# Patient Record
Sex: Female | Born: 1975 | Race: Asian | Hispanic: No | Marital: Married | Smoking: Never smoker
Health system: Southern US, Community
[De-identification: ages and names within clinical notes are randomized; demographics above are authoritative.]

## PROBLEM LIST (undated history)

## (undated) DIAGNOSIS — I10 Essential (primary) hypertension: Secondary | ICD-10-CM

## (undated) DIAGNOSIS — M549 Dorsalgia, unspecified: Secondary | ICD-10-CM

## (undated) DIAGNOSIS — Z923 Personal history of irradiation: Secondary | ICD-10-CM

## (undated) DIAGNOSIS — C801 Malignant (primary) neoplasm, unspecified: Secondary | ICD-10-CM

## (undated) DIAGNOSIS — K760 Fatty (change of) liver, not elsewhere classified: Secondary | ICD-10-CM

## (undated) HISTORY — DX: Essential (primary) hypertension: I10

## (undated) HISTORY — DX: Fatty (change of) liver, not elsewhere classified: K76.0

## (undated) HISTORY — DX: Dorsalgia, unspecified: M54.9

---

## 2007-02-22 DIAGNOSIS — M5126 Other intervertebral disc displacement, lumbar region: Secondary | ICD-10-CM | POA: Insufficient documentation

## 2010-05-13 DIAGNOSIS — E039 Hypothyroidism, unspecified: Secondary | ICD-10-CM | POA: Insufficient documentation

## 2014-11-03 DIAGNOSIS — E559 Vitamin D deficiency, unspecified: Secondary | ICD-10-CM | POA: Insufficient documentation

## 2014-11-03 DIAGNOSIS — R748 Abnormal levels of other serum enzymes: Secondary | ICD-10-CM | POA: Insufficient documentation

## 2016-06-26 DIAGNOSIS — E559 Vitamin D deficiency, unspecified: Secondary | ICD-10-CM | POA: Diagnosis not present

## 2016-06-26 DIAGNOSIS — Z0000 Encounter for general adult medical examination without abnormal findings: Secondary | ICD-10-CM | POA: Diagnosis not present

## 2016-06-26 DIAGNOSIS — Z124 Encounter for screening for malignant neoplasm of cervix: Secondary | ICD-10-CM | POA: Diagnosis not present

## 2016-06-26 DIAGNOSIS — E669 Obesity, unspecified: Secondary | ICD-10-CM | POA: Diagnosis not present

## 2016-06-26 DIAGNOSIS — Z6837 Body mass index (BMI) 37.0-37.9, adult: Secondary | ICD-10-CM | POA: Diagnosis not present

## 2016-06-26 DIAGNOSIS — Z113 Encounter for screening for infections with a predominantly sexual mode of transmission: Secondary | ICD-10-CM | POA: Diagnosis not present

## 2016-06-26 DIAGNOSIS — R945 Abnormal results of liver function studies: Secondary | ICD-10-CM | POA: Diagnosis not present

## 2016-06-26 DIAGNOSIS — Z975 Presence of (intrauterine) contraceptive device: Secondary | ICD-10-CM | POA: Diagnosis not present

## 2016-07-17 DIAGNOSIS — K760 Fatty (change of) liver, not elsewhere classified: Secondary | ICD-10-CM | POA: Diagnosis not present

## 2016-07-24 DIAGNOSIS — R7303 Prediabetes: Secondary | ICD-10-CM | POA: Diagnosis not present

## 2016-07-24 DIAGNOSIS — G8929 Other chronic pain: Secondary | ICD-10-CM | POA: Diagnosis not present

## 2016-07-24 DIAGNOSIS — E669 Obesity, unspecified: Secondary | ICD-10-CM | POA: Diagnosis not present

## 2016-07-24 DIAGNOSIS — M545 Low back pain: Secondary | ICD-10-CM | POA: Diagnosis not present

## 2016-07-25 DIAGNOSIS — R7303 Prediabetes: Secondary | ICD-10-CM | POA: Insufficient documentation

## 2016-08-02 DIAGNOSIS — Z309 Encounter for contraceptive management, unspecified: Secondary | ICD-10-CM | POA: Diagnosis not present

## 2016-08-07 DIAGNOSIS — Z713 Dietary counseling and surveillance: Secondary | ICD-10-CM | POA: Diagnosis not present

## 2016-08-07 DIAGNOSIS — E669 Obesity, unspecified: Secondary | ICD-10-CM | POA: Diagnosis not present

## 2016-08-08 DIAGNOSIS — R748 Abnormal levels of other serum enzymes: Secondary | ICD-10-CM | POA: Diagnosis not present

## 2016-08-08 DIAGNOSIS — G8929 Other chronic pain: Secondary | ICD-10-CM | POA: Diagnosis not present

## 2016-08-08 DIAGNOSIS — M545 Low back pain: Secondary | ICD-10-CM | POA: Diagnosis not present

## 2016-09-04 DIAGNOSIS — E669 Obesity, unspecified: Secondary | ICD-10-CM | POA: Diagnosis not present

## 2016-09-06 DIAGNOSIS — Z01812 Encounter for preprocedural laboratory examination: Secondary | ICD-10-CM | POA: Diagnosis not present

## 2016-09-06 DIAGNOSIS — Z30433 Encounter for removal and reinsertion of intrauterine contraceptive device: Secondary | ICD-10-CM | POA: Diagnosis not present

## 2016-09-06 DIAGNOSIS — Z975 Presence of (intrauterine) contraceptive device: Secondary | ICD-10-CM | POA: Insufficient documentation

## 2017-02-12 DIAGNOSIS — J302 Other seasonal allergic rhinitis: Secondary | ICD-10-CM | POA: Diagnosis not present

## 2017-02-19 DIAGNOSIS — J309 Allergic rhinitis, unspecified: Secondary | ICD-10-CM | POA: Insufficient documentation

## 2017-12-10 ENCOUNTER — Encounter: Payer: Self-pay | Admitting: Family

## 2017-12-10 ENCOUNTER — Ambulatory Visit: Payer: Managed Care, Other (non HMO) | Admitting: Family

## 2017-12-10 VITALS — BP 129/91 | HR 74 | Temp 98.6°F | Resp 16 | Ht 64.0 in | Wt 191.0 lb

## 2017-12-10 DIAGNOSIS — E669 Obesity, unspecified: Secondary | ICD-10-CM | POA: Diagnosis not present

## 2017-12-10 NOTE — Progress Notes (Signed)
Subjective:    Patient ID: Colleen Roach, female    DOB: Mar 25, 1976, 42 y.o.   MRN: [id number]  HPI  Patient is a 42 yr old female who presents today to establish care.  Reports tmax 2010 18 months ago. She reports that she was on phentermine x 1 year and got down ot 185.  She stopped about 2 months ago.    Wt Readings from Last 3 Encounters:  12/10/17 191 lb (86.6 kg)  Reports that she gained 60 pounds during her first pregnancy 13 years ago. She reports that she uses rowing machine 30 minutes 3 days a week.  24 hr diet recall:  Breakfast- yogurt drink (smoothie) coffee  Lunch- Fried rice  Dinner- chicken leg, noodles   Tangerine and apple for snack  She report hx of low back pain. Reports that she had a remote MRI and was told that she had DDD.  She has done PT in the past.  Reports pain is on/off.   Review of Systems  Constitutional: Negative for unexpected weight change.  HENT: Negative for rhinorrhea.   Eyes: Negative for visual disturbance.  Respiratory: Negative for cough.   Cardiovascular: Negative for leg swelling.  Gastrointestinal: Negative for constipation and diarrhea.  Genitourinary: Negative for dysuria and frequency.  Musculoskeletal: Negative for arthralgias and myalgias.  Skin:       Notes occasional rash on forehead, allergy medication helps  Neurological: Negative for headaches.  Hematological: Negative for adenopathy.  Psychiatric/Behavioral:       Denies depression/anxiety   Past Medical History:  Diagnosis Date  . Back pain      Social History   Socioeconomic History  . Marital status: Married    Spouse name: Not on file  . Number of children: 2  . Years of education: Restaurant manager, fast food  . Highest education level: Not on file  Social Needs  . Financial resource strain: Not hard at all  . Food insecurity - worry: Patient refused  . Food insecurity - inability: Patient refused  . Transportation needs - medical: No  . Transportation needs -  non-medical: No  Occupational History  . Occupation: Quarry manager  Tobacco Use  . Smoking status: Never Smoker  . Smokeless tobacco: Never Used  Substance and Sexual Activity  . Alcohol use: No    Frequency: Never  . Drug use: No  . Sexual activity: Yes    Partners: Male  Other Topics Concern  . 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 No pertinent surgical history.  Family History  Problem Relation Age of Onset  . Hypertension Mother   . Hypertension Father     Not on File  Current Outpatient Medications on File Prior to Visit  Medication Sig Dispense Refill  . Multiple Vitamins-Minerals (MULTIVITAMIN ADULT PO) Take by mouth.    . Omega-3 1000 MG CAPS Take by mouth.     No current facility-administered medications on file prior to visit.     BP (!) 129/91 (BP Location: Right Arm, Patient Position: Sitting, Cuff Size: Large)   Pulse 74   Temp 98.6 F (37 C) (Oral)   Resp 16   Ht 5'  4" (1.626 m)   Wt 191 lb (86.6 kg)   SpO2 100%   BMI 32.79 kg/m       Objective:   Physical Exam  Constitutional: She is oriented to person, place, and time. She appears well-developed and well-nourished.  Overweight Asian Female  HENT:  Head: Normocephalic and atraumatic.  Cardiovascular: Normal rate, regular rhythm and normal heart sounds.  No murmur heard. Pulmonary/Chest: Effort normal and breath sounds normal. No respiratory distress. She has no wheezes.  Abdominal: Soft. Bowel sounds are normal. She exhibits no distension. There is no tenderness. There is no rebound and no guarding.  Musculoskeletal: She exhibits no edema.  Lymphadenopathy:    She has no cervical adenopathy.  Neurological: She is alert and oriented to person, place, and  time.  Psychiatric: She has a normal mood and affect. Her behavior is normal. Judgment and thought content normal.          Assessment & Plan:  Obesity-we discussed adding more fresh vegetables to her diet and ensuring lean protein with each meal.  I suggested that she begin counting calories using an app such as my fitness pal.  She should set up set up a program with goal weight loss of 1-2 pounds per week.  I have suggested that she increase her exercise from 3-5 times weekly.  Try different exercises such as elliptical biking walking.  We discussed a referral to nutrition but she declines at this time and wishes to do on her own.  We will plan to bring her back in a few months to see how she is doing with her weight loss and to complete a physical with lab work.  A total of 25 minutes were spent face-to-face with the patient during this encounter and over half of that time was spent on counseling and coordination of care. The patient was counseled on healthy diet, exercise and weight loss.

## 2017-12-10 NOTE — Patient Instructions (Addendum)
Try using Myfitness Pal to set up a program to help you count calories. Goal weight loss is 1-2 pounds/week. Try to add more fresh veggies to your diet. Try to increase your exercise to 5 days a week.

## 2018-03-04 ENCOUNTER — Encounter: Payer: Self-pay | Admitting: Family

## 2018-03-04 ENCOUNTER — Ambulatory Visit (INDEPENDENT_AMBULATORY_CARE_PROVIDER_SITE_OTHER): Payer: Managed Care, Other (non HMO) | Admitting: Family

## 2018-03-04 VITALS — BP 142/82 | HR 79 | Temp 98.4°F | Resp 16 | Ht 64.0 in | Wt 197.8 lb

## 2018-03-04 DIAGNOSIS — R252 Cramp and spasm: Secondary | ICD-10-CM | POA: Diagnosis not present

## 2018-03-04 DIAGNOSIS — L989 Disorder of the skin and subcutaneous tissue, unspecified: Secondary | ICD-10-CM

## 2018-03-04 DIAGNOSIS — R102 Pelvic and perineal pain: Secondary | ICD-10-CM

## 2018-03-04 DIAGNOSIS — Z0001 Encounter for general adult medical examination with abnormal findings: Secondary | ICD-10-CM | POA: Diagnosis not present

## 2018-03-04 DIAGNOSIS — R030 Elevated blood-pressure reading, without diagnosis of hypertension: Secondary | ICD-10-CM

## 2018-03-04 DIAGNOSIS — Z0000 Encounter for general adult medical examination without abnormal findings: Secondary | ICD-10-CM | POA: Diagnosis not present

## 2018-03-04 LAB — MAGNESIUM: Magnesium: 2 mg/dL (ref 1.5–2.5)

## 2018-03-04 LAB — HCG, SERUM, QUALITATIVE: Preg, Serum: NEGATIVE

## 2018-03-04 MED ORDER — BETAMETHASONE DIPROPIONATE 0.05 % EX CREA: TOPICAL_CREAM | Freq: Two times a day (BID) | CUTANEOUS | 0 refills | Status: DC

## 2018-03-04 NOTE — Progress Notes (Addendum)
Subjective:    Patient ID: Colleen Roach, female    DOB: 1976/07/09, 42 y.o.   MRN: [id number]  HPI  Patient is a 42 yr old female who presents today for cpx.  Immunizations: unsure of exact date of last tetanus but believes <10 years ago. Diet: she reports that she eats a healthy diet. Eats between 10-4, limited sweets Breakfast:  Rice, chicken, vegetables 4PM rice, chicken, vegetables Drinks- water, occasional fruit/yogurt smoothie Exercise: enjoys walking during her breaks, 2x a day for 15 minutes, does a machine at home. Wt Readings from Last 3 Encounters:  03/04/18 197 lb 12.8 oz (89.7 kg)  12/10/17 191 lb (86.6 kg)   She reports that she is taking in about 1300 calories/day.  Pap Smear: 01/29/17 Mammogram: due Vision:  due Dental: had exam last week  Skin problem- reports skin lesions left side of face/neck.reports lesions can become very itchy and red at times. Allergy medication helps.  Pelvic Pain- reports + pain in the left pelvic area. Shereports that she has left sided pelvic pain with prolonged walking. This has been going on for a "long time."  Reports hx of ovarian cyst.    C/o intermittent leg cramping.    Review of Systems  Constitutional: Negative for unexpected weight change.  HENT: Negative for facial swelling and rhinorrhea.   Eyes: Negative for visual disturbance.  Respiratory: Negative for cough.   Cardiovascular: Negative for leg swelling.  Genitourinary: Negative for dysuria, frequency and hematuria.  Musculoskeletal: Positive for back pain. Negative for arthralgias and myalgias.  Skin:       Several spots on left face  Neurological: Negative for headaches.  Hematological: Negative for adenopathy.  Psychiatric/Behavioral:       Denies depression/anxiety   Past Medical History:  Diagnosis Date  . Back pain      Social History   Socioeconomic History  . Marital status: Married    Spouse name: Not on file  . Number of children: 2  . Years  of education: Restaurant manager, fast food  . Highest education level: Not on file  Occupational History  . Occupation: Quarry manager  Social Needs  . Financial resource strain: Not hard at all  . Food insecurity:    Worry: Patient refused    Inability: Patient refused  . Transportation needs:    Medical: No    Non-medical: No  Tobacco Use  . Smoking status: Never Smoker  . Smokeless tobacco: Never Used  Substance and Sexual Activity  . Alcohol use: No    Frequency: Never  . Drug use: No  . Sexual activity: Yes    Partners: Male  Lifestyle  . Physical activity:    Days per week: Not on file    Minutes per session: Not on file  . Stress: Not on file  Relationships  . Social connections:    Talks on phone: Not on file    Gets together: Not on file    Attends religious service: Not on file    Active member of club or organization: Not on file    Attends meetings of clubs or organizations: Not on file    Relationship status: Not on file  . Intimate partner violence:    Fear of current or ex partner: Not on file    Emotionally abused: Not on file    Physically abused: Not on file    Forced sexual activity: Not on file  Other Topics Concern  . 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  Family History  Problem Relation Age of Onset  . Hypertension Mother   . Hypertension Father     Not on File  Current Outpatient Medications on File Prior to Visit  Medication Sig Dispense Refill  . Multiple Vitamins-Minerals (MULTIVITAMIN ADULT PO) Take by mouth.    . Omega-3 1000 MG CAPS Take by mouth.     No current facility-administered medications on file prior to visit.     BP (!) 142/82 (BP Location: Left Arm, Patient Position: Sitting, Cuff Size:  Large)   Pulse 79   Temp 98.4 F (36.9 C) (Oral)   Resp 16   Ht 5\' 4"  (1.626 m)   Wt 197 lb 12.8 oz (89.7 kg)   SpO2 98%   BMI 33.95 kg/m       Objective:   Physical Exam  Physical Exam  Constitutional: She is oriented to person, place, and time. She appears well-developed and well-nourished. No distress.  HENT:  Head: Normocephalic and atraumatic.  Right Ear: Tympanic membrane and ear canal normal.  Left Ear: Tympanic membrane and ear canal normal.  Mouth/Throat: Oropharynx is clear and moist.  Eyes: Pupils are equal, round, and reactive to light. No scleral icterus.  Neck: Normal range of motion. No thyromegaly present.  Cardiovascular: Normal rate and regular rhythm.   No murmur heard. Pulmonary/Chest: Effort normal and breath sounds normal. No respiratory distress. He has no wheezes. She has no rales. She exhibits no tenderness.  Abdominal: Soft. Bowel sounds are normal. She exhibits no distension and no mass. There is very mild discomfort to palpation LLQ. There is no rebound and no guarding.  Musculoskeletal: She exhibits no edema.  Lymphadenopathy:    She has no cervical adenopathy.  Neurological: She is alert and oriented to person, place, and time. She has normal patellar reflexes. She exhibits normal muscle tone. Coordination normal.  Skin: Skin is warm and dry. several raised lesions noted near hairline left temple/behind ear (almost looks like pimples) Psychiatric: She has a normal mood and affect. Her behavior is normal. Judgment and thought content normal.  Breasts: Examined lying Right: Without masses, retractions, discharge or axillary adenopathy.  Left: Without masses, retractions, discharge or axillary adenopathy.             Assessment & Plan:    Elevated blood pressure reading- discussed low sodium diet. Follow up in 3 months for blood pressure recheck.  BP Readings from Last 3 Encounters:  03/04/18 (!) 142/82  12/10/17 (!) 129/91   Skin  lesions- ? Allergy related. Rx provided for betamethasone cream to be applied prn to affected areas.  Leg cramping- check magnesium.    Preventative care- discussed healthy diet, exercise, weight loss. Specifically decreasing calories to 1200/day and increasing her weight training.  Left pelvic pain- has hx of left sided ovarian cyst. Will obtain pelvic US. She has an IUD.  Will also check serum HCG.    Assessment & Plan:  EKG tracing is personally reviewed.  EKG notes NSR.  No acute changes.

## 2018-03-04 NOTE — Patient Instructions (Addendum)
Please complete lab work prior to leaving. Schedule mammogram on the first floor.  Please schedule a routine eye exam. Decrease your calorie intake to 1200 calories/day and try to add some light weight training.  Continue your cardio exercise. Work on low sodium diet.

## 2018-03-05 ENCOUNTER — Telehealth: Payer: Self-pay | Admitting: Family

## 2018-03-05 DIAGNOSIS — R945 Abnormal results of liver function studies: Secondary | ICD-10-CM

## 2018-03-05 DIAGNOSIS — R7989 Other specified abnormal findings of blood chemistry: Secondary | ICD-10-CM

## 2018-03-05 LAB — CBC WITH DIFFERENTIAL/PLATELET
BASOS PCT: 0.8 %
Basophils Absolute: 60 cells/uL (ref 0–200)
Eosinophils Absolute: 300 cells/uL (ref 15–500)
Eosinophils Relative: 4 %
HCT: 39.7 % (ref 35.0–45.0)
Hemoglobin: 13.4 g/dL (ref 11.7–15.5)
Lymphs Abs: 3210 cells/uL (ref 850–3900)
MCH: 28.5 pg (ref 27.0–33.0)
MCHC: 33.8 g/dL (ref 32.0–36.0)
MCV: 84.3 fL (ref 80.0–100.0)
MONOS PCT: 4.6 %
MPV: 11.1 fL (ref 7.5–12.5)
NEUTROS PCT: 47.8 %
Neutro Abs: 3585 cells/uL (ref 1500–7800)
PLATELETS: 244 10*3/uL (ref 140–400)
RBC: 4.71 10*6/uL (ref 3.80–5.10)
RDW: 12.1 % (ref 11.0–15.0)
TOTAL LYMPHOCYTE: 42.8 %
WBC mixed population: 345 cells/uL (ref 200–950)
WBC: 7.5 10*3/uL (ref 3.8–10.8)

## 2018-03-05 LAB — LIPID PANEL
CHOLESTEROL: 195 mg/dL (ref <200)
HDL: 40 mg/dL — ABNORMAL LOW (ref >50)
LDL CHOLESTEROL (CALC): 132 mg/dL — AB
Non-HDL Cholesterol (Calc): 155 mg/dL (calc) — ABNORMAL HIGH (ref <130)
Total CHOL/HDL Ratio: 4.9 (calc) (ref <5.0)
Triglycerides: 124 mg/dL (ref <150)

## 2018-03-05 LAB — HEPATIC FUNCTION PANEL
AG RATIO: 1.5 (calc) (ref 1.0–2.5)
ALBUMIN MSPROF: 4.2 g/dL (ref 3.6–5.1)
ALT: 50 U/L — AB (ref 6–29)
AST: 24 U/L (ref 10–30)
Alkaline phosphatase (APISO): 51 U/L (ref 33–115)
Bilirubin, Direct: 0.1 mg/dL (ref 0.0–0.2)
GLOBULIN: 2.8 g/dL (ref 1.9–3.7)
Indirect Bilirubin: 0.3 mg/dL (calc) (ref 0.2–1.2)
TOTAL PROTEIN: 7 g/dL (ref 6.1–8.1)
Total Bilirubin: 0.4 mg/dL (ref 0.2–1.2)

## 2018-03-05 LAB — BASIC METABOLIC PANEL
BUN: 12 mg/dL (ref 7–25)
CHLORIDE: 103 mmol/L (ref 98–110)
CO2: 23 mmol/L (ref 20–32)
Calcium: 9.2 mg/dL (ref 8.6–10.2)
Creat: 0.62 mg/dL (ref 0.50–1.10)
Glucose, Bld: 88 mg/dL (ref 65–99)
Potassium: 4.1 mmol/L (ref 3.5–5.3)
Sodium: 136 mmol/L (ref 135–146)

## 2018-03-05 LAB — URINALYSIS, ROUTINE W REFLEX MICROSCOPIC
Bilirubin Urine: NEGATIVE
Glucose, UA: NEGATIVE
HGB URINE DIPSTICK: NEGATIVE
KETONES UR: NEGATIVE
LEUKOCYTES UA: NEGATIVE
Nitrite: NEGATIVE
PROTEIN: NEGATIVE
Specific Gravity, Urine: 1.019 (ref 1.001–1.03)
pH: 5.5 (ref 5.0–8.0)

## 2018-03-05 LAB — TSH: TSH: 2.87 mIU/L

## 2018-03-05 LAB — HIV ANTIBODY (ROUTINE TESTING W REFLEX): HIV 1&2 Ab, 4th Generation: NONREACTIVE

## 2018-03-05 NOTE — Telephone Encounter (Signed)
Pregnancy test is negative, magnesium is negative. Kidney function, electrolytes, sugar look good.  HDL "good cholesterol" is a bit low and HDL bad cholesterol mildly elevated. Please continue to work on low fat/low cholesterol diet, exercise, weight loss. One of her liver tests is elevated.  I would like her to return for some additional blood work and abdominal US to check liver and gallbladder. Orders have been pended.

## 2018-03-07 NOTE — Telephone Encounter (Signed)
Called but no answer and mailbox was full. Will try again later.  

## 2018-03-10 DIAGNOSIS — R358 Other polyuria: Secondary | ICD-10-CM | POA: Diagnosis not present

## 2018-03-10 DIAGNOSIS — J020 Streptococcal pharyngitis: Secondary | ICD-10-CM | POA: Diagnosis not present

## 2018-03-11 ENCOUNTER — Encounter (HOSPITAL_BASED_OUTPATIENT_CLINIC_OR_DEPARTMENT_OTHER): Payer: Self-pay

## 2018-03-11 ENCOUNTER — Ambulatory Visit (HOSPITAL_BASED_OUTPATIENT_CLINIC_OR_DEPARTMENT_OTHER)
Admission: RE | Admit: 2018-03-11 | Discharge: 2018-03-11 | Disposition: A | Discharge status: Home / Self Care | Payer: Managed Care, Other (non HMO) | Source: Ambulatory Visit | Attending: Family | Admitting: Family

## 2018-03-11 DIAGNOSIS — Z0000 Encounter for general adult medical examination without abnormal findings: Secondary | ICD-10-CM | POA: Insufficient documentation

## 2018-03-11 DIAGNOSIS — Z1231 Encounter for screening mammogram for malignant neoplasm of breast: Secondary | ICD-10-CM | POA: Insufficient documentation

## 2018-03-11 DIAGNOSIS — R9389 Abnormal findings on diagnostic imaging of other specified body structures: Secondary | ICD-10-CM | POA: Diagnosis not present

## 2018-03-11 DIAGNOSIS — R102 Pelvic and perineal pain: Secondary | ICD-10-CM | POA: Diagnosis not present

## 2018-03-12 ENCOUNTER — Ambulatory Visit: Payer: Managed Care, Other (non HMO) | Admitting: Family

## 2018-03-12 ENCOUNTER — Encounter: Payer: Self-pay | Admitting: Family

## 2018-03-12 VITALS — BP 122/79 | HR 93 | Temp 99.3°F | Resp 16 | Ht 64.0 in | Wt 200.0 lb

## 2018-03-12 DIAGNOSIS — J020 Streptococcal pharyngitis: Secondary | ICD-10-CM | POA: Diagnosis not present

## 2018-03-12 DIAGNOSIS — R7989 Other specified abnormal findings of blood chemistry: Secondary | ICD-10-CM

## 2018-03-12 DIAGNOSIS — N83202 Unspecified ovarian cyst, left side: Secondary | ICD-10-CM

## 2018-03-12 DIAGNOSIS — R945 Abnormal results of liver function studies: Secondary | ICD-10-CM | POA: Diagnosis not present

## 2018-03-12 MED ORDER — CEFUROXIME AXETIL 500 MG PO TABS: 500.0000 mg | ORAL_TABLET | Freq: Two times a day (BID) | ORAL | 0 refills | Status: AC

## 2018-03-12 NOTE — Patient Instructions (Addendum)
You should be contacted about your abdominal ultrasound to check your liver, and referral to GYN for further evaluation of your ovarian cyst.  Please complete lab work prior to leaving.  Start ceftin for sore throat.

## 2018-03-12 NOTE — Progress Notes (Signed)
Subjective:    Patient ID: Colleen Roach, female    DOB: [birth year], 42 y.o.   MRN: [id number]  HPI  Strep Throat - Diagnosed on Saturday 03/08/18. Went to UC on Sunday. Was given amoxicillin and she thinks thar she had a reaction. Had "thick mucous (in throat) and diarrhea." She took x 2 days then stopped due to side effects. She reports that her sore throat is "much better."     Review of Systems See HPI  Past Medical History:  Diagnosis Date  . Back pain      Social History   Socioeconomic History  . Marital status: Married    Spouse name: Not on file  . Number of children: 2  . Years of education: Restaurant manager, fast food  . Highest education level: Not on file  Occupational History  . Occupation: Quarry manager  Social Needs  . Financial resource strain: Not hard at all  . Food insecurity:    Worry: Patient refused    Inability: Patient refused  . Transportation needs:    Medical: No    Non-medical: No  Tobacco Use  . Smoking status: Never Smoker  . Smokeless tobacco: Never Used  Substance and Sexual Activity  . Alcohol use: No    Frequency: Never  . Drug use: No  . Sexual activity: Yes    Partners: Male    Birth control/protection: IUD  Lifestyle  . Physical activity:    Days per week: Not on file    Minutes per session: Not on file  . Stress: Not on file  Relationships  . Social connections:    Talks on phone: Not on file    Gets together: Not on file    Attends religious service: Not on file    Active member of club or organization: Not on file    Attends meetings of clubs or organizations: Not on file    Relationship status: Not on file  . Intimate partner violence:    Fear of current or ex partner: Not on file    Emotionally abused: Not on file    Physically abused: Not on file    Forced sexual activity: Not on file  Other Topics Concern  . 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  Family History  Problem Relation Age of Onset  . Hypertension Mother   . Hypertension Father     Allergies  Allergen Reactions  . Augmentin [Amoxicillin-Pot Clavulanate]     Mucous in throat and diarrhea  . Fish Allergy Swelling    Facial Swelling   . Pollen Extract   . Almond (Diagnostic) Rash  . Cashew Nut Oil Rash  . Eggs Or Egg-Derived Products Rash  . Naproxen Sodium Rash  . Peanut-Containing Drug Products Rash  . Shellfish Allergy Rash    Current Outpatient Medications on File Prior to Visit  Medication Sig Dispense Refill  . amoxicillin-clavulanate (AUGMENTIN) 250-62.5 MG/5ML suspension     . betamethasone dipropionate (DIPROLENE) 0.05 % cream Apply topically 2 (two) times daily. 30 g 0  . Multiple Vitamins-Minerals (MULTIVITAMIN ADULT PO) Take by mouth.    Marland Kitchen  Omega-3 1000 MG CAPS Take by mouth.     No current facility-administered medications on file prior to visit.     BP 122/79 (BP Location: Left Arm, Patient Position: Sitting, Cuff Size: Large)   Pulse 93   Temp 99.3 F (37.4 C) (Oral)   Resp 16   Ht 5\' 4"  (1.626 m)   Wt 200 lb (90.7 kg)   SpO2 98%   BMI 34.33 kg/m       Objective:   Physical Exam  Constitutional: She appears well-developed and well-nourished.  HENT:  Right Ear: Tympanic membrane and ear canal normal.  Left Ear: Tympanic membrane and ear canal normal.  Mouth/Throat: No oropharyngeal exudate, posterior oropharyngeal edema or posterior oropharyngeal erythema.  Neck: Neck supple.  Cardiovascular: Normal rate, regular rhythm and normal heart sounds.  No murmur heard. Pulmonary/Chest: Effort normal and breath sounds normal. No respiratory distress. She has no wheezes.  Psychiatric: She has a normal mood and affect. Her behavior is normal. Judgment and thought  content normal.          Assessment & Plan:  Strep Throat- rx with ceftin to complete treatment. Clinically improved.  L adnexal cyst- recently noted on pelvic US. Will obtain CA-125 and refer to GYN for further evaluation.  Elevated LFT-Recently noted on lab work.  Pt reports that her numbers are actually much improved. Suspect fatty liver. Check abdominal US to further evaluate.  Lab Results  Component Value Date   ALT 50 (H) 03/04/2018   AST 24 03/04/2018   BILITOT 0.4 03/04/2018

## 2018-03-12 NOTE — Progress Notes (Deleted)
Went to UC on Sunday. Was given amoxicillin and she things thar she had a reaction. Had "thick mucous and diarrhea." She took x 2 days then stopped due to side effects. She reports that her sore throat is "much better."

## 2018-03-12 NOTE — Telephone Encounter (Signed)
Pt in office today and given below results.

## 2018-03-13 LAB — CA 125: CA 125: 20 U/mL (ref <35)

## 2018-03-18 ENCOUNTER — Ambulatory Visit (HOSPITAL_BASED_OUTPATIENT_CLINIC_OR_DEPARTMENT_OTHER)
Admission: RE | Admit: 2018-03-18 | Discharge: 2018-03-18 | Disposition: A | Discharge status: Home / Self Care | Payer: Managed Care, Other (non HMO) | Source: Ambulatory Visit | Attending: Family | Admitting: Family

## 2018-03-18 ENCOUNTER — Telehealth: Payer: Self-pay | Admitting: Family

## 2018-03-18 ENCOUNTER — Encounter: Payer: Self-pay | Admitting: Family

## 2018-03-18 DIAGNOSIS — K760 Fatty (change of) liver, not elsewhere classified: Secondary | ICD-10-CM | POA: Insufficient documentation

## 2018-03-18 DIAGNOSIS — K753 Granulomatous hepatitis, not elsewhere classified: Secondary | ICD-10-CM | POA: Diagnosis not present

## 2018-03-18 DIAGNOSIS — R945 Abnormal results of liver function studies: Secondary | ICD-10-CM | POA: Diagnosis present

## 2018-03-18 DIAGNOSIS — K7689 Other specified diseases of liver: Secondary | ICD-10-CM | POA: Diagnosis not present

## 2018-03-18 NOTE — Telephone Encounter (Signed)
[place] suggests fatty liver. Treatment is low fat diet/exercise/weight loss.

## 2018-03-18 NOTE — Telephone Encounter (Signed)
Notified pt and she voices understanding. 

## 2018-03-27 ENCOUNTER — Encounter

## 2018-03-27 ENCOUNTER — Ambulatory Visit: Payer: Managed Care, Other (non HMO) | Admitting: Obstetrics and Gynecology

## 2018-04-25 ENCOUNTER — Other Ambulatory Visit: Payer: Self-pay

## 2018-04-25 ENCOUNTER — Ambulatory Visit: Payer: Managed Care, Other (non HMO) | Admitting: Obstetrics and Gynecology

## 2018-04-25 ENCOUNTER — Encounter

## 2018-04-25 ENCOUNTER — Encounter: Payer: Self-pay | Admitting: Obstetrics and Gynecology

## 2018-04-25 VITALS — BP 132/80 | HR 84 | Resp 16 | Ht 63.5 in | Wt 200.0 lb

## 2018-04-25 DIAGNOSIS — N949 Unspecified condition associated with female genital organs and menstrual cycle: Secondary | ICD-10-CM

## 2018-04-25 DIAGNOSIS — N393 Stress incontinence (female) (male): Secondary | ICD-10-CM

## 2018-04-25 DIAGNOSIS — N9489 Other specified conditions associated with female genital organs and menstrual cycle: Secondary | ICD-10-CM

## 2018-04-25 DIAGNOSIS — R102 Pelvic and perineal pain: Secondary | ICD-10-CM | POA: Diagnosis not present

## 2018-04-25 NOTE — Patient Instructions (Addendum)
Endometriosis Endometriosis is a condition in which the tissue that lines the uterus (endometrium) grows outside of its normal location. The tissue may grow in many locations close to the uterus, but it commonly grows on the ovaries, fallopian tubes, vagina, or bowel. When the uterus sheds the endometrium every menstrual cycle, there is bleeding wherever the endometrial tissue is located. This can cause pain because blood is irritating to tissues that are not normally exposed to it. What are the causes? The cause of endometriosis is not known. What increases the risk? You may be more likely to develop endometriosis if you:  Have a family history of endometriosis.  Have never given birth.  Started your period at age 10 or younger.  Have high levels of estrogen in your body.  Were exposed to a certain medicine (diethylstilbestrol) before you were born (in utero).  Had low birth weight.  Were born as a twin, triplet, or other multiple.  Have a BMI of less than 25. BMI is an estimate of body fat and is calculated from height and weight.  What are the signs or symptoms? Often, there are no symptoms of this condition. If you do have symptoms, they may:  Vary depending on where your endometrial tissue is growing.  Occur during your menstrual period (most common) or midcycle.  Come and go, or you may go months with no symptoms at all.  Stop with menopause.  Symptoms may include:  Pain in the back or abdomen.  Heavier bleeding during periods.  Pain during sex.  Painful bowel movements.  Infertility.  Pelvic pain.  Bleeding more than once a month.  How is this diagnosed? This condition is diagnosed based on your symptoms and a physical exam. You may have tests, such as:  Blood tests and urine tests. These may be done to help rule out other possible causes of your symptoms.  Ultrasound, to look for abnormal tissues.  An X-ray of the lower bowel (barium enema).  An  ultrasound that is done through the vagina (transvaginally).  CT scan.  MRI.  Laparoscopy. In this procedure, a lighted, pencil-sized instrument called a laparoscope is inserted into your abdomen through an incision. The laparoscope allows your health care provider to look at the organs inside your body and check for abnormal tissue to confirm the diagnosis. If abnormal tissue is found, your health care provider may remove a small piece of tissue (biopsy) to be examined under a microscope.  How is this treated? Treatment for this condition may include:  Medicines to relieve pain, such as NSAIDs.  Hormone therapy. This involves using artificial (synthetic) hormones to reduce endometrial tissue growth. Your health care provider may recommend using a hormonal form of birth control, or other medicines.  Surgery. This may be done to remove abnormal endometrial tissue. ? In some cases, tissue may be removed using a laparoscope and a laser (laparoscopic laser treatment). ? In severe cases, surgery may be done to remove the fallopian tubes, uterus, and ovaries (hysterectomy).  Follow these instructions at home:  Take over-the-counter and prescription medicines only as told by your health care provider.  Do not drive or use heavy machinery while taking prescription pain medicine.  Try to avoid activities that cause pain, including sexual activity.  Keep all follow-up visits as told by your health care provider. This is important. Contact a health care provider if:  You have pain in the area between your hip bones (pelvic area) that occurs: ? Before, during, or   after your period. ? In between your period and gets worse during your period. ? During or after sex. ? With bowel movements or urination, especially during your period.  You have problems getting pregnant.  You have a fever. Get help right away if:  You have severe pain that does not get better with medicine.  You have severe  nausea and vomiting, or you cannot eat without vomiting.  You have pain that affects only the lower, right side of your abdomen.  You have abdominal pain that gets worse.  You have abdominal swelling.  You have blood in your stool. This information is not intended to replace advice given to you by your health care provider. Make sure you discuss any questions you have with your health care provider. Document Released: 10/13/2000 Document Revised: 07/21/2016 Document Reviewed: 03/18/2016 Elsevier Interactive Patient Education  2018 [employer]. Kegel Exercises Kegel exercises help strengthen the muscles that support the rectum, vagina, small intestine, bladder, and uterus. Doing Kegel exercises can help:  Improve bladder and bowel control.  Improve sexual response.  Reduce problems and discomfort during pregnancy.  Kegel exercises involve squeezing your pelvic floor muscles, which are the same muscles you squeeze when you try to stop the flow of urine. The exercises can be done while sitting, standing, or lying down, but it is best to vary your position. Phase 1 exercises 1. Squeeze your pelvic floor muscles tight. You should feel a tight lift in your rectal area. If you are a female, you should also feel a tightness in your vaginal area. Keep your stomach, buttocks, and legs relaxed. 2. Hold the muscles tight for up to 10 seconds. 3. Relax your muscles. Repeat this exercise 50 times a day or as many times as told by your health care provider. Continue to do this exercise for at least 4-6 weeks or for as long as told by your health care provider. This information is not intended to replace advice given to you by your health care provider. Make sure you discuss any questions you have with your health care provider. Document Released: 10/02/2012 Document Revised: 06/10/2016 Document Reviewed: 09/05/2015 Elsevier Interactive Patient Education  2018 [employer]. Ovarian Cyst An ovarian  cyst is a fluid-filled sac that forms on an ovary. The ovaries are small organs that produce eggs in women. Various types of cysts can form on the ovaries. Some may cause symptoms and require treatment. Most ovarian cysts go away on their own, are not cancerous (are benign), and do not cause problems. Common types of ovarian cysts include:  Functional (follicle) cysts. ? Occur during the menstrual cycle, and usually go away with the next menstrual cycle if you do not get pregnant. ? Usually cause no symptoms.  Endometriomas. ? Are cysts that form from the tissue that lines the uterus (endometrium). ? Are sometimes called "chocolate cysts" because they become filled with blood that turns brown. ? Can cause pain in the lower abdomen during intercourse and during your period.  Cystadenoma cysts. ? Develop from cells on the outside surface of the ovary. ? Can get very large and cause lower abdomen pain and pain with intercourse. ? Can cause severe pain if they twist or break open (rupture).  Dermoid cysts. ? Are sometimes found in both ovaries. ? May contain different kinds of body tissue, such as skin, teeth, hair, or cartilage. ? Usually do not cause symptoms unless they get very big.  Theca lutein cysts. ? Occur when too much  of a certain hormone (human chorionic gonadotropin) is produced and overstimulates the ovaries to produce an egg. ? Are most common after having procedures used to assist with the conception of a baby (in vitro fertilization).  What are the causes? Ovarian cysts may be caused by:  Ovarian hyperstimulation syndrome. This is a condition that can develop from taking fertility medicines. It causes multiple large ovarian cysts to form.  Polycystic ovarian syndrome (PCOS). This is a common hormonal disorder that can cause ovarian cysts, as well as problems with your period or fertility.  What increases the risk? The following factors may make you more likely to  develop ovarian cysts:  Being overweight or obese.  Taking fertility medicines.  Taking certain forms of hormonal birth control.  Smoking.  What are the signs or symptoms? Many ovarian cysts do not cause symptoms. If symptoms are present, they may include:  Pelvic pain or pressure.  Pain in the lower abdomen.  Pain during sex.  Abdominal swelling.  Abnormal menstrual periods.  Increasing pain with menstrual periods.  How is this diagnosed? These cysts are commonly found during a routine pelvic exam. You may have tests to find out more about the cyst, such as:  Ultrasound.  X-ray of the pelvis.  CT scan.  MRI.  Blood tests.  How is this treated? Many ovarian cysts go away on their own without treatment. Your health care provider may want to check your cyst regularly for 2-3 months to see if it changes. If you are in menopause, it is especially important to have your cyst monitored closely because menopausal women have a higher rate of ovarian cancer. When treatment is needed, it may include:  Medicines to help relieve pain.  A procedure to drain the cyst (aspiration).  Surgery to remove the whole cyst.  Hormone treatment or birth control pills. These methods are sometimes used to help dissolve a cyst.  Follow these instructions at home:  Take over-the-counter and prescription medicines only as told by your health care provider.  Do not drive or use heavy machinery while taking prescription pain medicine.  Get regular pelvic exams and Pap tests as often as told by your health care provider.  Return to your normal activities as told by your health care provider. Ask your health care provider what activities are safe for you.  Do not use any products that contain nicotine or tobacco, such as cigarettes and e-cigarettes. If you need help quitting, ask your health care provider.  Keep all follow-up visits as told by your health care provider. This is  important. Contact a health care provider if:  Your periods are late, irregular, or painful, or they stop.  You have pelvic pain that does not go away.  You have pressure on your bladder or trouble emptying your bladder completely.  You have pain during sex.  You have any of the following in your abdomen: ? A feeling of fullness. ? Pressure. ? Discomfort. ? Pain that does not go away. ? Swelling.  You feel generally ill.  You become constipated.  You lose your appetite.  You develop severe acne.  You start to have more body hair and facial hair.  You are gaining weight or losing weight without changing your exercise and eating habits.  You think you may be pregnant. Get help right away if:  You have abdominal pain that is severe or gets worse.  You cannot eat or drink without vomiting.  You suddenly develop a fever.  Your menstrual period is much heavier than usual. This information is not intended to replace advice given to you by your health care provider. Make sure you discuss any questions you have with your health care provider. Document Released: 10/16/2005 Document Revised: 05/05/2016 Document Reviewed: 03/19/2016 Elsevier Interactive Patient Education  [employer].

## 2018-04-25 NOTE — Progress Notes (Signed)
42 y.o. [id number] MarriedAsianF here referred by PCP, [name], NP for oa consultation for a pelvic mass.  She has a mirena IUD replaced about a year ago. No cycles. She c/o an intermittent pressure in her LLQ/pelvis for years. Recent ultrasound showed:  Left ovary Measurements: 2.5 x 2.1 by 2.6 cm.  A bilobed cystic lesion is seen in the left adnexa which measures 7.0 x 3.8 by 6.4 cm. This has uniform low-level internal echoes and no definite internal blood flow on color Doppler ultrasound. This is suspicious for endometrioma, although cystic ovarian neoplasm cannot definitely be Excluded.  CA 125 was 20.  H/O mild back pain with her cycles in the past.  In 2012 she was told that she had a cyst on the left side, was told to have yearly ultrasounds, never got around to the ultrasounds.   Sexually active, no pain.     No LMP recorded. (Menstrual status: IUD).          Sexually active: Yes.    The current method of family planning is IUD.    Exercising: Yes.    Walking/ biking  Smoker:  no  Health Maintenance: Pap:  2017 normal per patient  History of abnormal Pap:  no MMG:  03-11-18 WNL  Colonoscopy:  Never BMD:   Never TDaP:  2011 Gardasil: no    reports that she has never smoked. She has never used smokeless tobacco. She reports that she does not drink alcohol or use drugs. Works as a Web designer. Daughters are 13 and 6.5.   Past Medical History:  Diagnosis Date  . Back pain   . Fatty liver     History reviewed. No pertinent surgical history.  Current Outpatient Medications  Medication Sig Dispense Refill  . Multiple Vitamins-Minerals (MULTIVITAMIN ADULT PO) Take by mouth.    . Omega-3 1000 MG CAPS Take by mouth.     No current facility-administered medications for this visit.     Family History  Problem Relation Age of Onset  . Hypertension Mother   . Hypertension Father     Review of Systems  Constitutional: Negative.   HENT: Negative.   Eyes:  Negative.   Respiratory: Negative.   Cardiovascular: Negative.   Gastrointestinal: Negative.   Endocrine: Negative.   Genitourinary: Positive for pelvic pain.       LLQ pain   Musculoskeletal: Negative.   Skin: Negative.   Allergic/Immunologic: Negative.   Neurological: Negative.   Psychiatric/Behavioral: Negative.   She c/o GSI only when she has a URI  Exam:   BP 132/80 (BP Location: Right Arm, Patient Position: Sitting, Cuff Size: Normal)   Pulse 84   Resp 16   Ht 5' 3.5" (1.613 m)   Wt 200 lb (90.7 kg)   BMI 34.87 kg/m   Weight change: @WEIGHTCHANGE @ Height:   Height: 5' 3.5" (161.3 cm)  Ht Readings from Last 3 Encounters:  04/25/18 5' 3.5" (1.613 m)  03/12/18 5\' 4"  (1.626 m)  03/04/18 5\' 4"  (1.626 m)    General appearance: alert, cooperative and appears stated age Head: Normocephalic, without obvious abnormality, atraumatic Neck: no adenopathy, supple, symmetrical, trachea midline and thyroid normal to inspection and palpation Lungs: clear to auscultation bilaterally Cardiovascular: regular rate and rhythm Abdomen: soft, non-tender; non distended,  no masses,  no organomegaly Extremities: extremities normal, atraumatic, no cyanosis or edema Skin: Skin color, texture, turgor normal. No rashes or lesions Lymph nodes: Cervical, supraclavicular, and axillary nodes normal. No abnormal inguinal nodes  palpated Neurologic: Grossly normal   Pelvic: External genitalia:  no lesions              Urethra:  normal appearing urethra with no masses, tenderness or lesions              Bartholins and Skenes: normal                 Vagina: normal appearing vagina with normal color and discharge, no lesions              Cervix: no lesions and IUD string 2 cm               Bimanual Exam:  Uterus:  normal size, contour, position, consistency, mobility, non-tender and anteverted              Adnexa: no mass, fullness, tenderness               Rectovaginal: on rectovaginal exam a mass is  felt in the left adnexa, not mobile               Anus:  normal sphincter tone, no lesions  Chaperone was present for exam.  A:  Left adnexal mass, suspect endometrioma, 7 cm  P:    Will try and get a copy of her prior ultrasound, h/o cyst on the left  Recommended laparoscopy, with left ovarian cystectomy, possible left oophorectomy, possible treatment of endometriosis, discussed the option of bilateral salpingectomy (she will consider, may want to continue with the IUD and better coverage if she has it for contraception)  Discussed risks of laparoscopy, including but not limited to: infection, damage to bowel/bladder/ureters or vessels.   Would do a bowel prep  CC: [name], NP Note sent

## 2018-06-02 NOTE — Progress Notes (Signed)
Subjective:    Patient ID: Colleen Roach, female    DOB: 06-18-1976, 42 y.o.   MRN: [id number]  HPI  Colleen Roach is a 42 yr old female who presents today for follow up.   She was noted previously to have an elevated blood pressure.  BP Readings from Last 3 Encounters:  06/03/18 133/82  04/25/18 132/80  03/12/18 122/79     Skin lesions- reports no lesions today but that she will sometimes get itchy hives after she eats nuts. Has not see allergist.  Pelvic Pain/left adnexal mass- noted on [place]. She was referred to GYN and evaluated by Dr. Sumner Boast. She recommended  laparoscopy, with left ovarian cystectomy, possible left oophorectomy, possible treatment of endometriosis. This has not been completed. She tells me that she has decided to proceed with surgery.      Review of Systems See HPI  Past Medical History:  Diagnosis Date  . Back pain   . Fatty liver   . Hypertension      Social History   Socioeconomic History  . Marital status: Married    Spouse name: Not on file  . Number of children: 2  . Years of education: Restaurant manager, fast food  . Highest education level: Not on file  Occupational History  . Occupation: Quarry manager  Social Needs  . Financial resource strain: Not hard at all  . Food insecurity:    Worry: Patient refused    Inability: Patient refused  . Transportation needs:    Medical: No    Non-medical: No  Tobacco Use  . Smoking status: Never Smoker  . Smokeless tobacco: Never Used  Substance and Sexual Activity  . Alcohol use: No    Frequency: Never  . Drug use: No  . Sexual activity: Yes    Partners: Male    Birth control/protection: IUD    Comment: Mirena   Lifestyle  . Physical activity:    Days per week: Not on file    Minutes per session: Not on file  . Stress: Not on file  Relationships  . Social connections:    Talks on phone: Not on file    Gets together: Not on file    Attends religious service: Not on file    Active member of club or  organization: Not on file    Attends meetings of clubs or organizations: Not on file    Relationship status: Not on file  . Intimate partner violence:    Fear of current or ex partner: Not on file    Emotionally abused: Not on file    Physically abused: Not on file    Forced sexual activity: Not on file  Other Topics Concern  . 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  Family History  Problem Relation Age of Onset  . Hypertension Mother   . Hypertension Father     Allergies  Allergen Reactions  . Augmentin [Amoxicillin-Pot Clavulanate]     Mucous in throat and diarrhea  . Fish Allergy Swelling    Facial Swelling   . Pollen Extract   . Almond (Diagnostic) Rash  .  Cashew Nut Oil Rash  . Eggs Or Egg-Derived Products Rash  . Naproxen Sodium Rash  . Peanut-Containing Drug Products Rash  . Shellfish Allergy Rash    Current Outpatient Medications on File Prior to Visit  Medication Sig Dispense Refill  . Multiple Vitamins-Minerals (MULTIVITAMIN ADULT PO) Take by mouth.    . Omega-3 1000 MG CAPS Take by mouth.     No current facility-administered medications on file prior to visit.     BP 133/82 (BP Location: Right Arm, Cuff Size: Large)   Pulse 74   Temp 98 F (36.7 C) (Oral)   Resp 18   Ht 5\' 4"  (1.626 m)   Wt 200 lb 9.6 oz (91 kg)   SpO2 100%   BMI 34.43 kg/m       Objective:   Physical Exam  Constitutional: She is oriented to person, place, and time. She appears well-developed and well-nourished.  Cardiovascular: Normal rate, regular rhythm and normal heart sounds.  No murmur heard. Pulmonary/Chest: Effort normal and breath sounds normal. No respiratory distress. She has no wheezes.  Neurological: She  is alert and oriented to person, place, and time.  Skin: Skin is warm and dry.  Psychiatric: She has a normal mood and affect. Her behavior is normal. Judgment and thought content normal.          Assessment & Plan:  Elevated blood pressure reading-BP looks good today.  We will continue to monitor.  Intermittent urticaria- will refer to allergist. Discussed nut avoidance and keeping an epipen on hand in case of a severe allergic reaction.  We discussed that if she develops tongue/lip swelling or shortness of breath to administer the EpiPen and call 911.  She requests a refill of her betamethasone cream.  Refill has been provided.  Pelvic mass-this is being managed by gynecology and she plans to proceed with surgery.

## 2018-06-03 ENCOUNTER — Encounter: Payer: Self-pay | Admitting: Family

## 2018-06-03 ENCOUNTER — Ambulatory Visit: Payer: Managed Care, Other (non HMO) | Admitting: Family

## 2018-06-03 VITALS — BP 133/82 | HR 74 | Temp 98.0°F | Resp 18 | Ht 64.0 in | Wt 200.6 lb

## 2018-06-03 DIAGNOSIS — L509 Urticaria, unspecified: Secondary | ICD-10-CM | POA: Diagnosis not present

## 2018-06-03 DIAGNOSIS — R1900 Intra-abdominal and pelvic swelling, mass and lump, unspecified site: Secondary | ICD-10-CM | POA: Diagnosis not present

## 2018-06-03 DIAGNOSIS — Z91018 Allergy to other foods: Secondary | ICD-10-CM

## 2018-06-03 DIAGNOSIS — R030 Elevated blood-pressure reading, without diagnosis of hypertension: Secondary | ICD-10-CM

## 2018-06-03 MED ORDER — EPINEPHRINE 0.3 MG/0.3ML IJ SOAJ: 0.3000 mg | Freq: Once | INTRAMUSCULAR | 0 refills | Status: AC

## 2018-06-03 MED ORDER — BETAMETHASONE DIPROPIONATE 0.05 % EX CREA: TOPICAL_CREAM | Freq: Two times a day (BID) | CUTANEOUS | 0 refills | Status: DC

## 2018-06-03 NOTE — Patient Instructions (Signed)
Please keep an epipen with you in case of severe allergic reaction (tongue/lip swelling/shortness of breath). Avoid nuts.  You should be contacted about scheduling your appointment with the allergist.

## 2019-03-15 IMAGING — MG DIGITAL SCREENING BILATERAL MAMMOGRAM WITH TOMO AND CAD
4 series · 4 of 16 positions shown · non-contrast
Comparison: None.

CLINICAL DATA: Screening.

EXAM:
DIGITAL SCREENING BILATERAL MAMMOGRAM WITH TOMO AND CAD

[L MLO synth-2D]
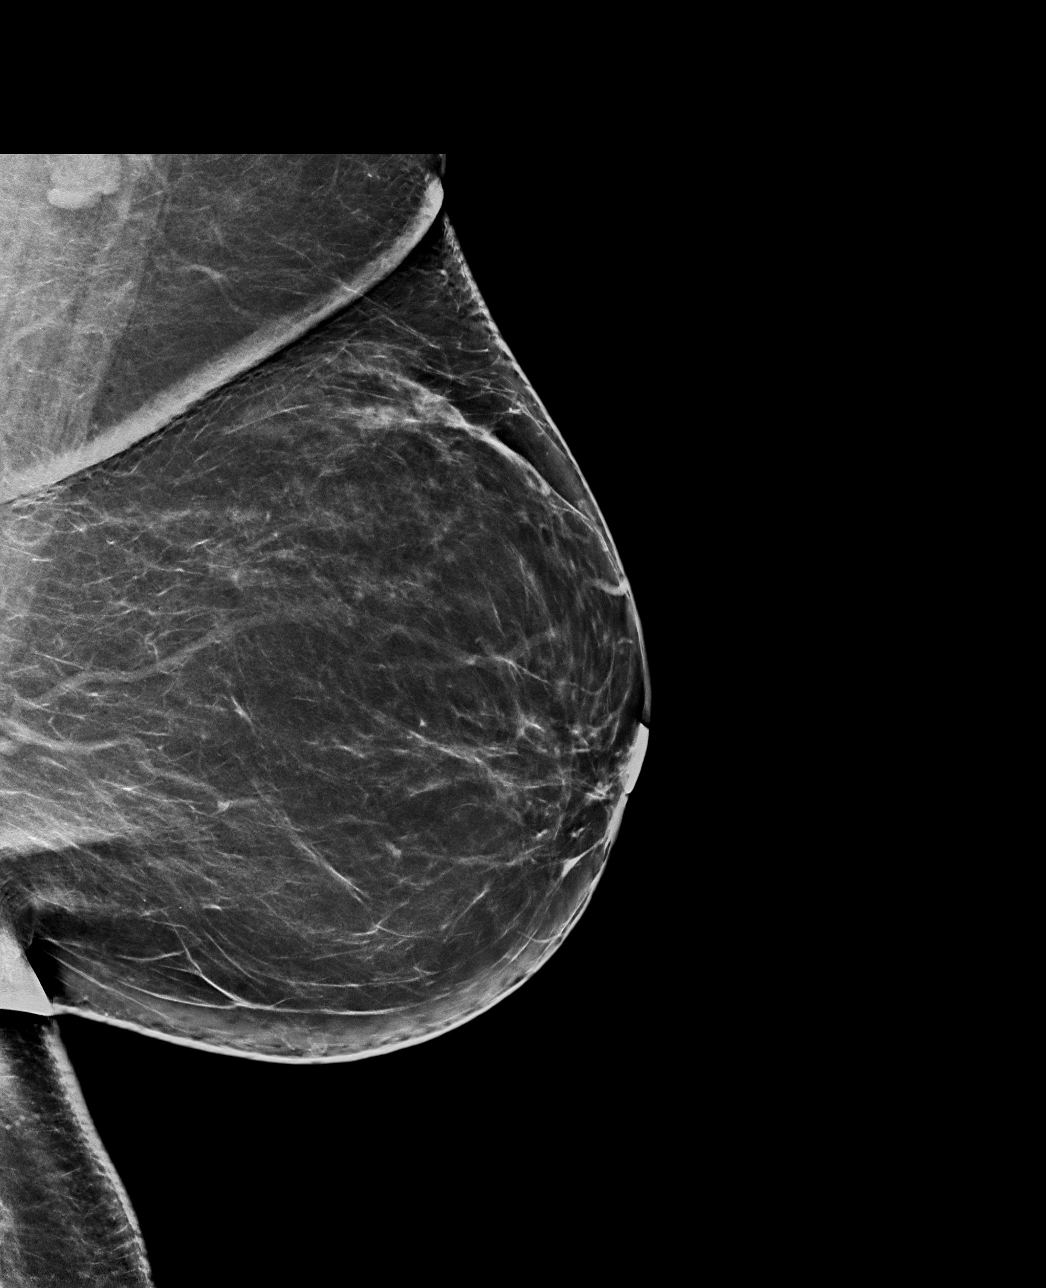

[L MLO tomo (1 of 2) · tomo slice 41/82.0]
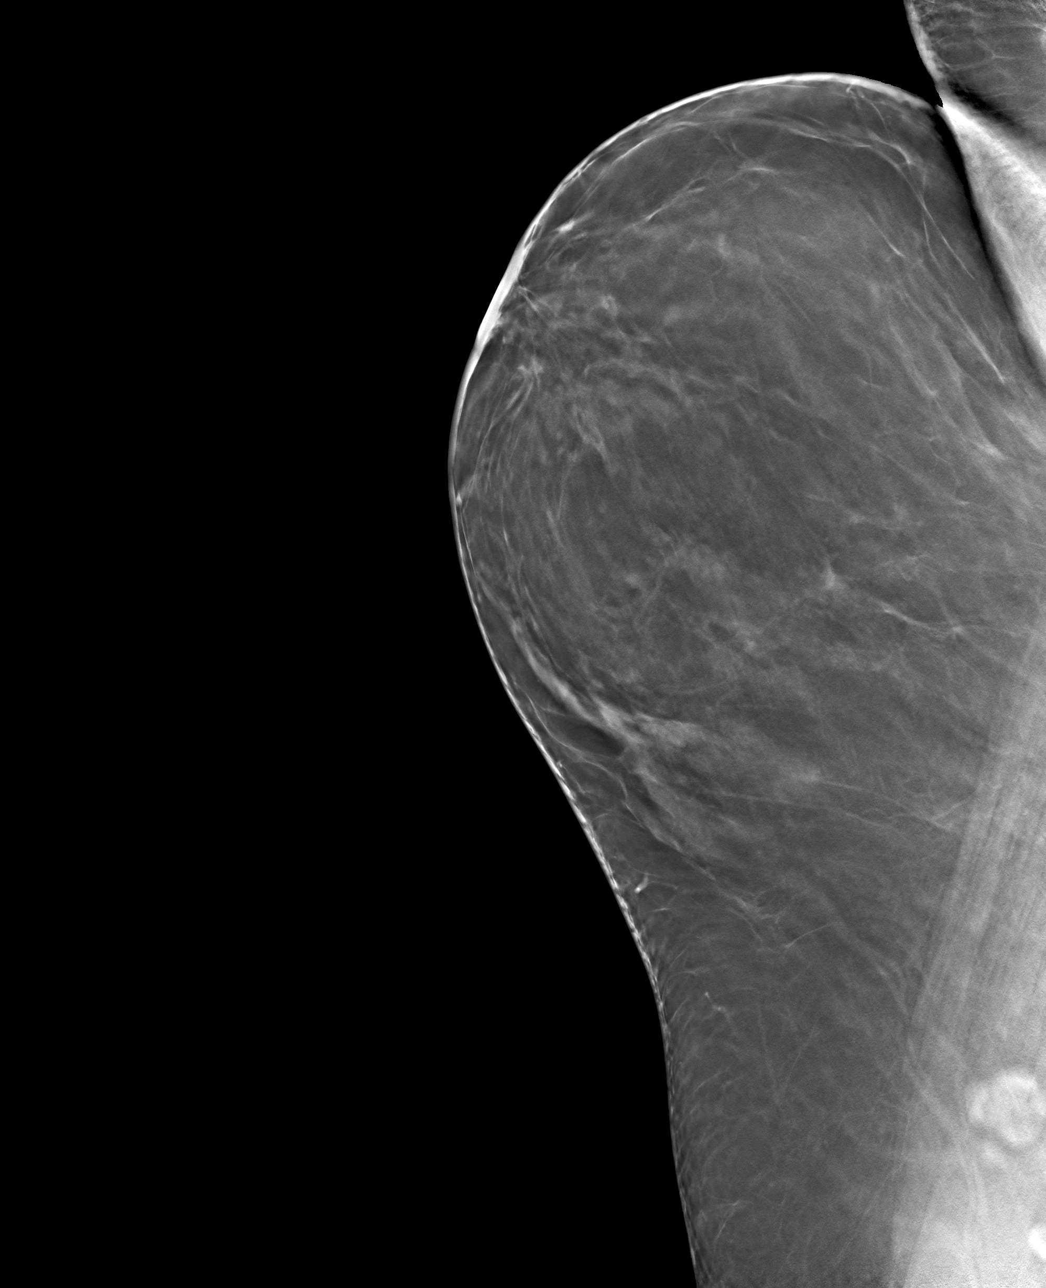

[L MLO tomo (2 of 2) · tomo slice 41/82.0]
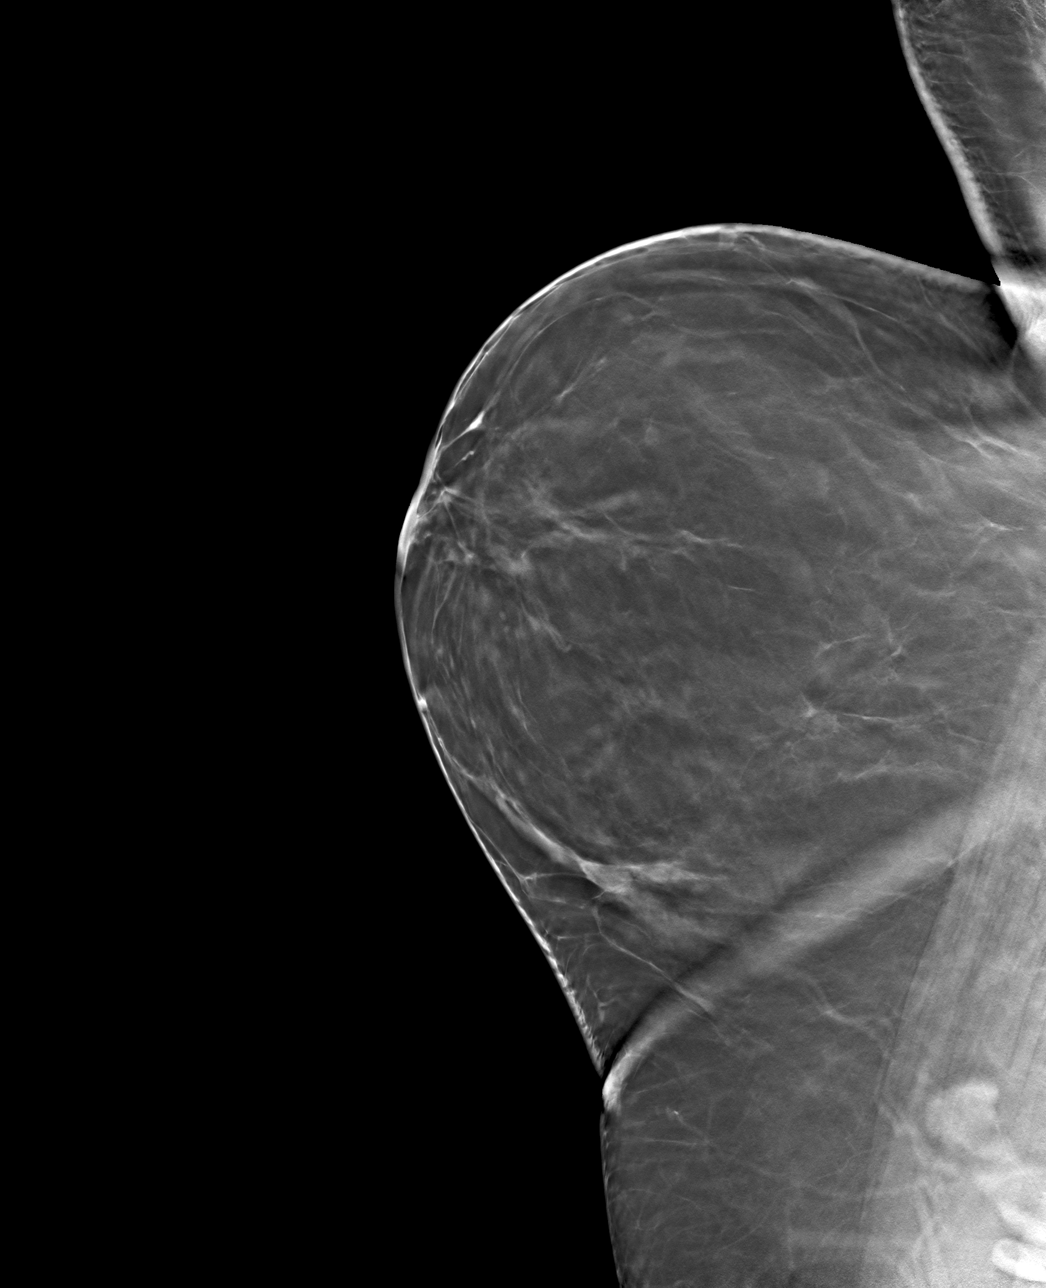

[R MLO tomo · tomo slice 43/86.0]
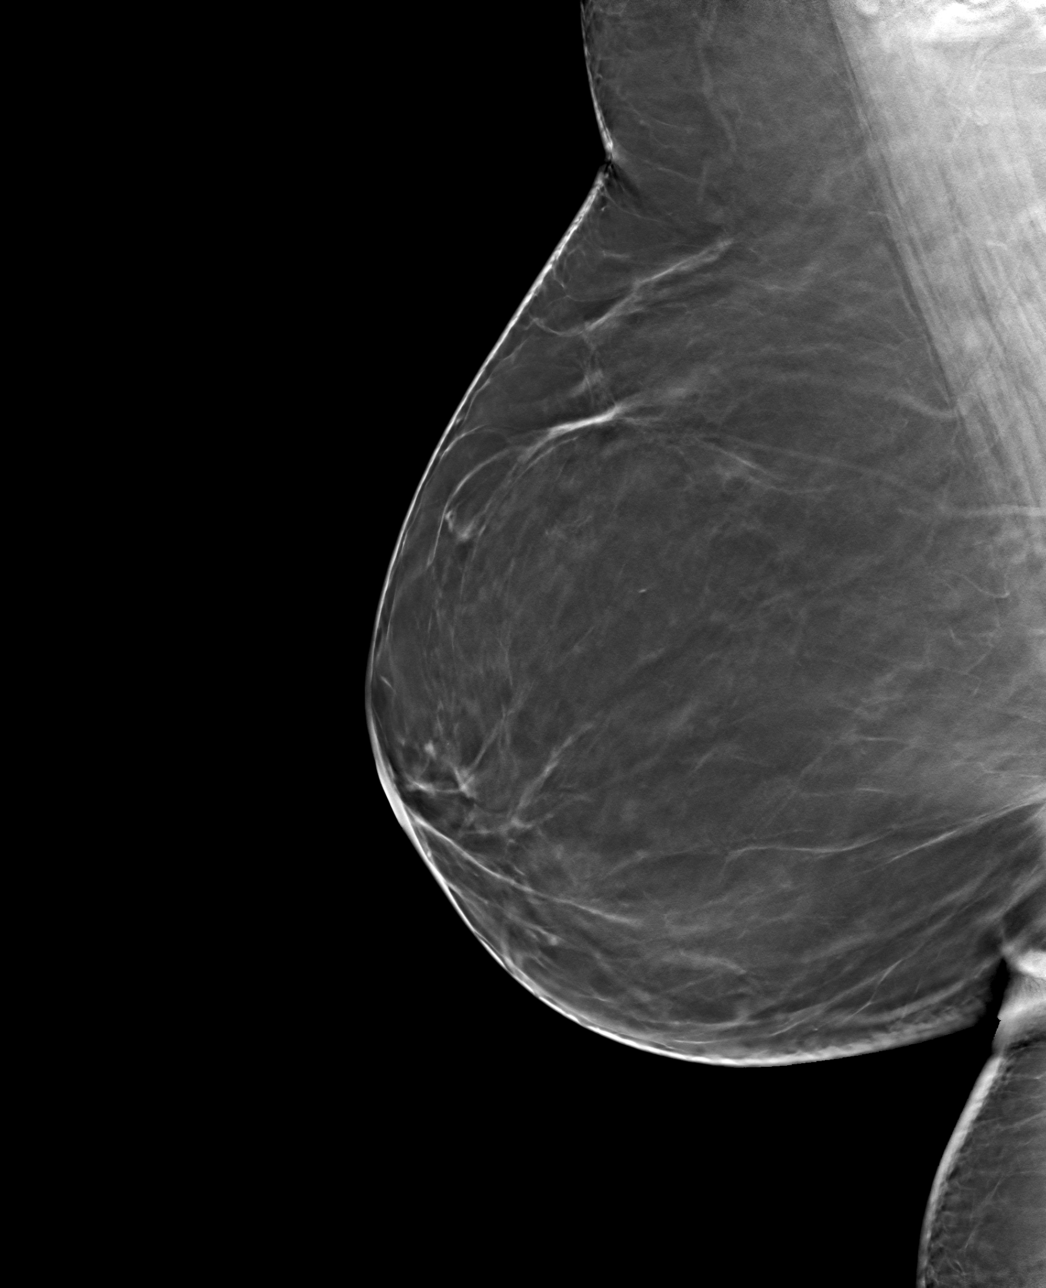

[4 of 16 positions shown; findings below may reference images not displayed]

ACR Breast Density Category b: There are scattered areas of
fibroglandular density.
FINDINGS: There are no findings suspicious for malignancy. Images were
processed with CAD.
IMPRESSION: No mammographic evidence of malignancy. A result letter of this
screening mammogram will be mailed directly to the patient.

RECOMMENDATION:
Screening mammogram in one year. (Code:[id number]-EJ6)

BI-RADS CATEGORY  1: Negative.

## 2019-03-17 ENCOUNTER — Encounter: Payer: Managed Care, Other (non HMO) | Admitting: Family

## 2019-03-19 ENCOUNTER — Ambulatory Visit (INDEPENDENT_AMBULATORY_CARE_PROVIDER_SITE_OTHER): Payer: 59 | Admitting: Family

## 2019-03-19 ENCOUNTER — Other Ambulatory Visit: Payer: Self-pay

## 2019-03-19 ENCOUNTER — Encounter: Payer: Self-pay | Admitting: Family

## 2019-03-19 ENCOUNTER — Telehealth: Payer: Self-pay | Admitting: Family

## 2019-03-19 VITALS — BP 152/96 | HR 88 | Temp 98.8°F | Resp 16 | Ht 64.0 in | Wt 214.0 lb

## 2019-03-19 DIAGNOSIS — Z0000 Encounter for general adult medical examination without abnormal findings: Secondary | ICD-10-CM | POA: Diagnosis not present

## 2019-03-19 DIAGNOSIS — Z23 Encounter for immunization: Secondary | ICD-10-CM | POA: Diagnosis not present

## 2019-03-19 MED ORDER — LORATADINE 10 MG PO TABS: 10.0000 mg | ORAL_TABLET | Freq: Every day | ORAL | 3 refills | Status: AC

## 2019-03-19 MED ORDER — ONE-A-DAY WOMENS FORMULA PO TABS: ORAL_TABLET | ORAL | 3 refills | Status: AC

## 2019-03-19 MED ORDER — GUAIFENESIN ER 600 MG PO TB12: 600.0000 mg | ORAL_TABLET | Freq: Two times a day (BID) | ORAL | 5 refills | Status: DC | PRN

## 2019-03-19 MED ORDER — OMEGA-3 1000 MG PO CAPS: ORAL_CAPSULE | ORAL | 3 refills | Status: DC

## 2019-03-19 NOTE — Patient Instructions (Addendum)
Please schedule a routine eye exam. You may use mucinex as needed for chest congestion.  Please continue to work on healthy diet, portion control, regular exercise and weight loss.

## 2019-03-19 NOTE — Progress Notes (Signed)
Subjective:    Patient ID: Colleen Roach, female    DOB: 1975-12-19, 43 y.o.   MRN: [id number]  HPI  Patient presents today for complete physical.  Immunizations: due for tetanus Diet:reports diet is healthy Wt Readings from Last 3 Encounters:  03/19/19 214 lb (97.1 kg)  06/03/18 200 lb 9.6 oz (91 kg)  04/25/18 200 lb (90.7 kg)  Exercise: walks 40 minutes every other  Pap Smear: due 4/21, sees GYN Mammogram: due  Dental: up to date Vision: never  BP Readings from Last 3 Encounters:  03/19/19 (!) 152/96  06/03/18 133/82  04/25/18 132/80     Review of Systems  Constitutional: Positive for unexpected weight change.  HENT: Negative for postnasal drip.        Reports some upper airway congestion- chronic despite otc allergy medicine  Eyes: Negative for visual disturbance.  Respiratory: Negative for cough and shortness of breath.   Cardiovascular: Negative for chest pain and leg swelling.  Gastrointestinal: Negative for diarrhea, nausea and vomiting.  Genitourinary: Negative for dysuria, frequency and hematuria.  Musculoskeletal: Negative for arthralgias and myalgias.  Skin: Negative for rash.  Neurological: Negative for headaches.  Hematological: Negative for adenopathy.  Psychiatric/Behavioral:       Denies depression/anxiety   Past Medical History:  Diagnosis Date  . Back pain   . Fatty liver   . Hypertension      Social History   Socioeconomic History  . Marital status: Married    Spouse name: Not on file  . Number of children: 2  . Years of education: Restaurant manager, fast food  . Highest education level: Not on file  Occupational History  . Occupation: Quarry manager  Social Needs  . Financial resource strain: Not hard at all  . Food insecurity:    Worry: Patient refused    Inability: Patient refused  . Transportation needs:    Medical: No    Non-medical: No  Tobacco Use  . Smoking status: Never Smoker  . Smokeless tobacco: Never Used  Substance and Sexual Activity   . Alcohol use: No    Frequency: Never  . Drug use: No  . Sexual activity: Yes    Partners: Male    Birth control/protection: I.U.D.    Comment: Mirena   Lifestyle  . Physical activity:    Days per week: Not on file    Minutes per session: Not on file  . Stress: Not on file  Relationships  . Social connections:    Talks on phone: Not on file    Gets together: Not on file    Attends religious service: Not on file    Active member of club or organization: Not on file    Attends meetings of clubs or organizations: Not on file    Relationship status: Not on file  . Intimate partner violence:    Fear of current or ex partner: Not on file    Emotionally abused: Not on file    Physically abused: Not on file    Forced sexual activity: Not on file  Other Topics Concern  . 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  Family History  Problem Relation Age of Onset  . Hypertension Mother   . Hypertension Father     Allergies  Allergen Reactions  . Augmentin [Amoxicillin-Pot Clavulanate]     Mucous in throat and diarrhea  . Fish Allergy Swelling    Facial Swelling   . Pollen Extract   . Almond (Diagnostic) Rash  . Cashew Nut Oil Rash  . Eggs Or Egg-Derived Products Rash  . Naproxen Sodium Rash  . Peanut-Containing Drug Products Rash  . Shellfish Allergy Rash    Current Outpatient Medications on File Prior to Visit  Medication Sig Dispense Refill  . betamethasone dipropionate (DIPROLENE) 0.05 % cream Apply topically 2 (two) times daily. 30 g 0   No current facility-administered medications on file prior to visit.     BP (!) 152/96 (BP Location: Right Arm, Patient Position: Sitting, Cuff Size: Large)   Pulse 88    Temp 98.8 F (37.1 C) (Oral)   Resp 16   Ht 5\' 4"  (1.626 m)   Wt 214 lb (97.1 kg)   SpO2 99%   BMI 36.73 kg/m       Objective:   Physical Exam Physical Exam  Constitutional: She is oriented to person, place, and time. She appears well-developed and well-nourished. No distress.  HENT:  Head: Normocephalic and atraumatic.  Right Ear: Tympanic membrane and ear canal normal.  Left Ear: Tympanic membrane and ear canal normal.  Mouth/Throat: Oropharynx is clear and moist.  Eyes: Pupils are equal, round, and reactive to light. No scleral icterus.  Neck: Normal range of motion. No thyromegaly present.  Cardiovascular: Normal rate and regular rhythm.   No murmur heard. Pulmonary/Chest: Effort normal and breath sounds normal. No respiratory distress. He has no wheezes. She has no rales. She exhibits no tenderness.  Abdominal: Soft. Bowel sounds are normal. She exhibits no distension and no mass. There is no tenderness. There is no rebound and no guarding.  Musculoskeletal: She exhibits no edema.  Lymphadenopathy:    She has no cervical adenopathy.  Neurological: She is alert and oriented to person, place, and time. She has normal patellar reflexes. She exhibits normal muscle tone. Coordination normal.  Skin: Skin is warm and dry.  Psychiatric: She has a normal mood and affect. Her behavior is normal. Judgment and thought content normal.  Breasts: Examined lying Right: Without masses, retractions, discharge or axillary adenopathy.  Left: Without masses, retractions, discharge or axillary adenopathy.          Assessment & Plan:          Assessment & Plan:  Elevated blood pressure reading- Will plan BP recheck in 1 month.   Preventative care- discussed healthy diet, exercise and weight loss.  Pap up to date. Td today. Obtain routine lab work.  She is requesting rx for OTC meds so she can use her flex spend. Advised pt to stop fish oil due to hx of fish allergy. Notes if she does  not take allergy med with it can "have a reaction." refer for mammogram. Recommended that she schedule a routine eye exam.

## 2019-03-19 NOTE — Telephone Encounter (Signed)
Please let pt know that I saw that her BP was elevated at her visit today. I think we should repeat her blood pressure in 1 month with me. Limit sodium and work on exercise and weight loss.

## 2019-03-21 ENCOUNTER — Telehealth: Payer: Self-pay | Admitting: *Deleted

## 2019-03-21 NOTE — Telephone Encounter (Signed)
Rod Holler -- I received a call from the Lab that they received an add on request from you but they said they did not have any blood on this pt.  I called [facility]) and added tests.

## 2019-03-21 NOTE — Telephone Encounter (Signed)
Information and provider's advise given to patient. Scheduled for in office follow up 04-21-19

## 2019-03-24 LAB — CBC WITH DIFFERENTIAL/PLATELET
Absolute Monocytes: 451 cells/uL (ref 200–950)
Basophils Absolute: 68 cells/uL (ref 0–200)
Basophils Relative: 0.8 %
Eosinophils Absolute: 323 cells/uL (ref 15–500)
Eosinophils Relative: 3.8 %
HCT: 42.9 % (ref 35.0–45.0)
Hemoglobin: 14 g/dL (ref 11.7–15.5)
Lymphs Abs: 3264 cells/uL (ref 850–3900)
MCH: 28.2 pg (ref 27.0–33.0)
MCHC: 32.6 g/dL (ref 32.0–36.0)
MCV: 86.3 fL (ref 80.0–100.0)
MPV: 11.5 fL (ref 7.5–12.5)
Monocytes Relative: 5.3 %
Neutro Abs: 4395 cells/uL (ref 1500–7800)
Neutrophils Relative %: 51.7 %
Platelets: 274 10*3/uL (ref 140–400)
RBC: 4.97 10*6/uL (ref 3.80–5.10)
RDW: 13 % (ref 11.0–15.0)
Total Lymphocyte: 38.4 %
WBC: 8.5 10*3/uL (ref 3.8–10.8)

## 2019-03-24 LAB — TEST AUTHORIZATION

## 2019-03-24 LAB — T3, FREE: T3, Free: 2.7 pg/mL (ref 2.3–4.2)

## 2019-03-24 LAB — LIPID PANEL
Cholesterol: 225 mg/dL — ABNORMAL HIGH (ref <200)
HDL: 43 mg/dL — ABNORMAL LOW (ref >=50)
LDL Cholesterol (Calc): 148 mg/dL (calc) — ABNORMAL HIGH
Non-HDL Cholesterol (Calc): 182 mg/dL (calc) — ABNORMAL HIGH (ref <130)
Total CHOL/HDL Ratio: 5.2 (calc) — ABNORMAL HIGH (ref <5.0)
Triglycerides: 195 mg/dL — ABNORMAL HIGH (ref <150)

## 2019-03-24 LAB — BASIC METABOLIC PANEL
BUN: 12 mg/dL (ref 7–25)
CO2: 20 mmol/L (ref 20–32)
Calcium: 9.5 mg/dL (ref 8.6–10.2)
Chloride: 104 mmol/L (ref 98–110)
Creat: 0.64 mg/dL (ref 0.50–1.10)
Glucose, Bld: 95 mg/dL (ref 65–99)
Potassium: 4.3 mmol/L (ref 3.5–5.3)
Sodium: 138 mmol/L (ref 135–146)

## 2019-03-24 LAB — HEPATIC FUNCTION PANEL
AG Ratio: 1.6 (calc) (ref 1.0–2.5)
ALT: 69 U/L — ABNORMAL HIGH (ref 6–29)
AST: 34 U/L — ABNORMAL HIGH (ref 10–30)
Albumin: 4.5 g/dL (ref 3.6–5.1)
Alkaline phosphatase (APISO): 53 U/L (ref 31–125)
Bilirubin, Direct: 0.1 mg/dL (ref 0.0–0.2)
Globulin: 2.8 g/dL (calc) (ref 1.9–3.7)
Indirect Bilirubin: 0.3 mg/dL (calc) (ref 0.2–1.2)
Total Bilirubin: 0.4 mg/dL (ref 0.2–1.2)
Total Protein: 7.3 g/dL (ref 6.1–8.1)

## 2019-03-24 LAB — TSH: TSH: 5.07 mIU/L — ABNORMAL HIGH

## 2019-03-24 LAB — T4, FREE: Free T4: 1.2 ng/dL (ref 0.8–1.8)

## 2019-03-25 NOTE — Telephone Encounter (Signed)
Noted, thanks!

## 2019-03-31 ENCOUNTER — Other Ambulatory Visit: Payer: Self-pay

## 2019-03-31 ENCOUNTER — Encounter (HOSPITAL_BASED_OUTPATIENT_CLINIC_OR_DEPARTMENT_OTHER): Payer: Self-pay

## 2019-03-31 ENCOUNTER — Ambulatory Visit (HOSPITAL_BASED_OUTPATIENT_CLINIC_OR_DEPARTMENT_OTHER)
Admission: RE | Admit: 2019-03-31 | Discharge: 2019-03-31 | Disposition: A | Discharge status: Home / Self Care | Payer: 59 | Source: Ambulatory Visit | Attending: Family | Admitting: Family

## 2019-03-31 DIAGNOSIS — E039 Hypothyroidism, unspecified: Secondary | ICD-10-CM

## 2019-03-31 DIAGNOSIS — E038 Other specified hypothyroidism: Secondary | ICD-10-CM

## 2019-03-31 DIAGNOSIS — Z1231 Encounter for screening mammogram for malignant neoplasm of breast: Secondary | ICD-10-CM | POA: Diagnosis present

## 2019-03-31 DIAGNOSIS — Z0000 Encounter for general adult medical examination without abnormal findings: Secondary | ICD-10-CM

## 2019-04-21 ENCOUNTER — Ambulatory Visit: Payer: 59 | Admitting: Family

## 2019-04-25 ENCOUNTER — Ambulatory Visit: Payer: 59 | Admitting: Family

## 2019-04-25 ENCOUNTER — Other Ambulatory Visit: Payer: Self-pay

## 2019-04-25 ENCOUNTER — Encounter: Payer: Self-pay | Admitting: Family

## 2019-04-25 VITALS — BP 149/77 | HR 68 | Temp 98.6°F | Resp 16

## 2019-04-25 DIAGNOSIS — I10 Essential (primary) hypertension: Secondary | ICD-10-CM | POA: Diagnosis not present

## 2019-04-25 MED ORDER — AMLODIPINE BESYLATE 5 MG PO TABS: 5.0000 mg | ORAL_TABLET | Freq: Every day | ORAL | 3 refills | Status: DC

## 2019-04-25 NOTE — Patient Instructions (Signed)
Please begin amlodipine 5mg  once daily for your blood pressure.

## 2019-04-25 NOTE — Progress Notes (Signed)
Subjective:    Patient ID: Colleen Roach, female    DOB: 08-Mar-1976, 43 y.o.   MRN: [id number]  HPI  Patient is a 43 yr old female who presents today for a 1 month follow up of her blood pressure. Last visit bp was noted to be elevated.  We discussed dietary changes and advised pt to follow up for repeat blood pressure.   She denies LE edema, SOB or headache.  BP Readings from Last 3 Encounters:  04/25/19 (!) 149/77  03/19/19 (!) 152/96  06/03/18 133/82      Review of Systems    see HPI  Past Medical History:  Diagnosis Date  . Back pain   . Fatty liver   . Hypertension      Social History   Socioeconomic History  . Marital status: Married    Spouse name: Not on file  . Number of children: 2  . Years of education: Restaurant manager, fast food  . Highest education level: Not on file  Occupational History  . Occupation: Quarry manager  Social Needs  . Financial resource strain: Not hard at all  . Food insecurity    Worry: Patient refused    Inability: Patient refused  . Transportation needs    Medical: No    Non-medical: No  Tobacco Use  . Smoking status: Never Smoker  . Smokeless tobacco: Never Used  Substance and Sexual Activity  . Alcohol use: No    Frequency: Never  . Drug use: No  . Sexual activity: Yes    Partners: Male    Birth control/protection: I.U.D.    Comment: Mirena   Lifestyle  . Physical activity    Days per week: Not on file    Minutes per session: Not on file  . Stress: Not on file  Relationships  . Social Herbalist on phone: Not on file    Gets together: Not on file    Attends religious service: Not on file    Active member of club or organization: Not on file    Attends meetings of clubs or organizations: Not on file    Relationship status: Not on file  . Intimate partner violence    Fear of current or ex partner: Not on file    Emotionally abused: Not on file    Physically abused: Not on file    Forced sexual activity: Not on file   Other Topics Concern  . 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  Family History  Problem Relation Age of Onset  . Hypertension Mother   . Hypertension Father     Allergies  Allergen Reactions  . Augmentin [Amoxicillin-Pot Clavulanate]     Mucous in throat and diarrhea  . Fish Allergy Swelling    Facial Swelling   . Pollen Extract   . Almond (Diagnostic) Rash  . Cashew Nut Oil Rash  . Eggs Or Egg-Derived Products Rash  . Naproxen Sodium Rash  . Peanut-Containing Drug Products Rash  . Shellfish Allergy Rash    Current Outpatient Medications on File Prior to Visit  Medication Sig Dispense Refill  .  betamethasone dipropionate (DIPROLENE) 0.05 % cream Apply topically 2 (two) times daily. 30 g 0  . guaiFENesin (MUCINEX) 600 MG 12 hr tablet Take 1 tablet (600 mg total) by mouth 2 (two) times daily as needed for to loosen phlegm. 30 tablet 5  . loratadine (CLARITIN) 10 MG tablet Take 1 tablet (10 mg total) by mouth daily. 90 tablet 3  . Multiple Vitamins-Calcium (ONE-A-DAY WOMENS FORMULA) TABS One tab by mouth once daily 90 tablet 3   No current facility-administered medications on file prior to visit.     BP (!) 149/77 (BP Location: Left Arm, Patient Position: Sitting, Cuff Size: Large)   Pulse 68   Temp 98.6 F (37 C) (Oral)   Resp 16   SpO2 100%    Objective:   Physical Exam Constitutional:      Appearance: She is well-developed.  Neck:     Musculoskeletal: Neck supple.     Thyroid: No thyromegaly.  Cardiovascular:     Rate and Rhythm: Normal rate and regular rhythm.     Heart sounds: Normal heart sounds. No murmur.  Pulmonary:     Effort: Pulmonary effort is normal. No respiratory distress.     Breath  sounds: Normal breath sounds. No wheezing.  Skin:    General: Skin is warm and dry.  Neurological:     Mental Status: She is alert and oriented to person, place, and time.  Psychiatric:        Behavior: Behavior normal.        Thought Content: Thought content normal.        Judgment: Judgment normal.           Assessment & Plan:  HTN- bp remains above goal. Reinforced importance of low sodium diet, exercise, weight loss. Will initiate amlodipine 5mg  once daily. Plan to repeat bp in 2 weeks at nurse visit.

## 2019-05-13 ENCOUNTER — Ambulatory Visit: Payer: 59

## 2019-05-13 ENCOUNTER — Other Ambulatory Visit: Payer: Self-pay

## 2019-05-13 DIAGNOSIS — I10 Essential (primary) hypertension: Secondary | ICD-10-CM

## 2019-05-13 NOTE — Progress Notes (Signed)
Pt here for Blood pressure check per Melissa  Pt currently takes: Amlodipine 5mg    Pt reports compliance with medication.  BP today @ = 118/82 HR = 79  Pt advised per Melissa keep the same regimen as before  And re check in 3 months. Patient voiced her understanding and made appointment for 3 months

## 2019-05-29 ENCOUNTER — Telehealth: Payer: Self-pay | Admitting: Family

## 2019-05-29 NOTE — Telephone Encounter (Signed)
Patient has lab appt Monday 06/02/19 at 9:30 calling to see if there is an earlier appt. Please advise Cb- [phone]

## 2019-05-30 NOTE — Telephone Encounter (Signed)
LM for pt, no earlier lab appts available

## 2019-06-02 ENCOUNTER — Other Ambulatory Visit: Payer: 59

## 2019-06-04 ENCOUNTER — Encounter: Payer: Self-pay | Admitting: Family

## 2019-06-04 ENCOUNTER — Other Ambulatory Visit: Payer: Self-pay

## 2019-06-04 ENCOUNTER — Other Ambulatory Visit (INDEPENDENT_AMBULATORY_CARE_PROVIDER_SITE_OTHER): Payer: Managed Care, Other (non HMO)

## 2019-06-04 DIAGNOSIS — E039 Hypothyroidism, unspecified: Secondary | ICD-10-CM

## 2019-06-04 DIAGNOSIS — E038 Other specified hypothyroidism: Secondary | ICD-10-CM

## 2019-06-04 LAB — TSH: TSH: 3.94 u[IU]/mL (ref 0.35–4.50)

## 2019-06-04 LAB — T3, FREE: T3, Free: 3.3 pg/mL (ref 2.3–4.2)

## 2019-06-04 LAB — T4, FREE: Free T4: 0.77 ng/dL (ref 0.60–1.60)

## 2019-06-10 NOTE — Progress Notes (Signed)
Mailed out to patient 

## 2019-07-17 ENCOUNTER — Other Ambulatory Visit: Payer: Self-pay | Admitting: Family

## 2019-08-13 ENCOUNTER — Ambulatory Visit: Payer: 59 | Admitting: Family

## 2019-08-20 ENCOUNTER — Other Ambulatory Visit: Payer: Self-pay

## 2019-08-20 ENCOUNTER — Ambulatory Visit (INDEPENDENT_AMBULATORY_CARE_PROVIDER_SITE_OTHER): Payer: Managed Care, Other (non HMO) | Admitting: Family

## 2019-08-20 DIAGNOSIS — I10 Essential (primary) hypertension: Secondary | ICD-10-CM

## 2019-08-20 NOTE — Progress Notes (Signed)
CMA note reviewed and agree.  

## 2019-08-20 NOTE — Progress Notes (Signed)
Pt here for Blood pressure check per   Pt currently takes: Amlodipine 5 MG, 1 tablet by mouth daily   Pt reports compliance with medication.  BP today @ = 110/80 HR = 90  Pt advised per Valaria Good continue current medications and follow up in 3 months.   BP Readings from Last 3 Encounters:  04/25/19 (!) 149/77  03/19/19 (!) 152/96  06/03/18 133/82

## 2019-09-25 IMAGING — US US ABDOMEN COMPLETE
1 series · 13 of 25 positions shown · non-contrast
Comparison: None.

CLINICAL DATA: Elevated liver enzymes

EXAM:
ABDOMEN ULTRASOUND COMPLETE

[Series 1: us abdomen complete · 0.25mm/px · 13 of 103 slices shown]
[im 1/103]
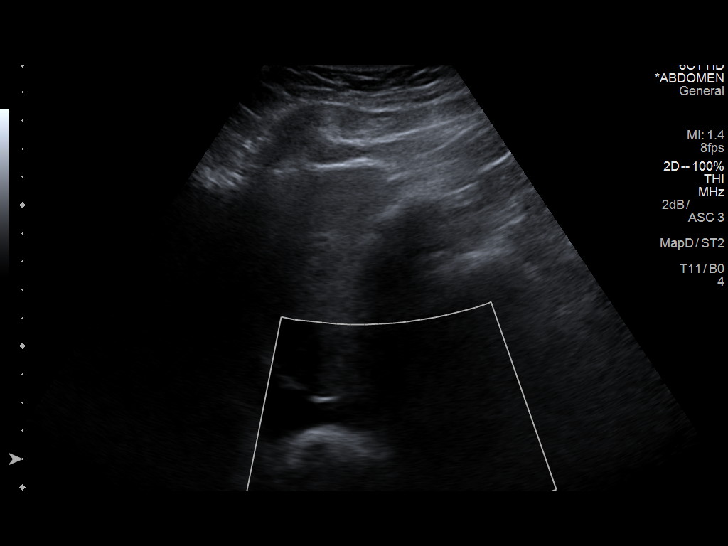
[im 9/103]
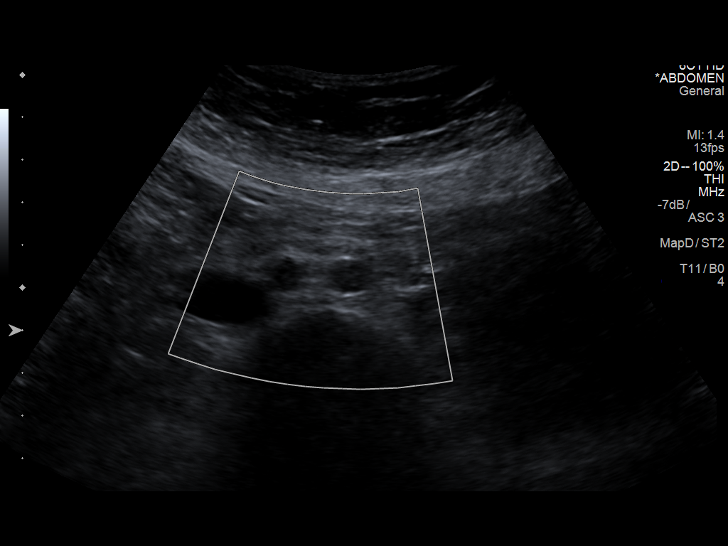
[im 18/103]
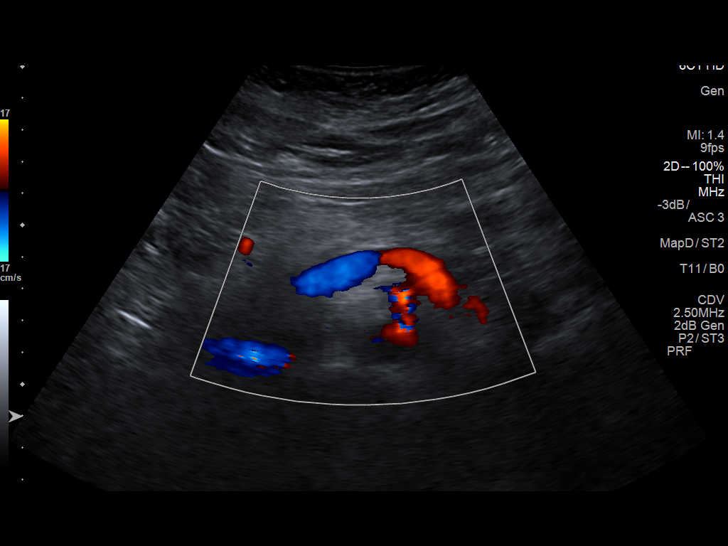
[im 26/103]
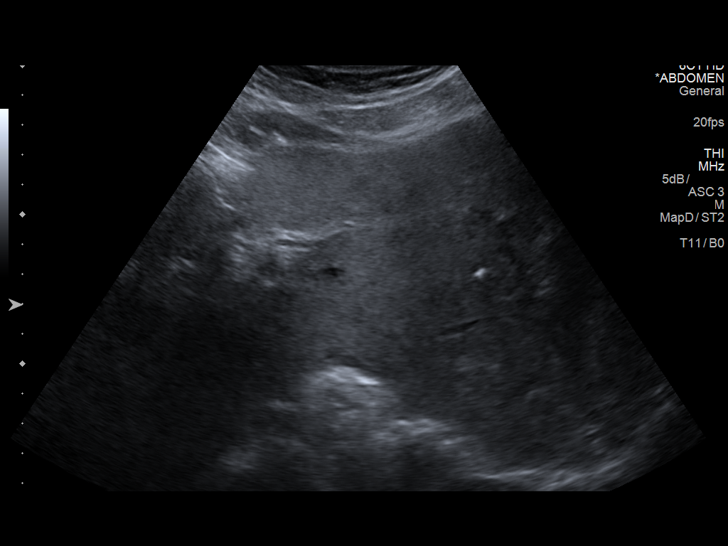
[im 35/103]
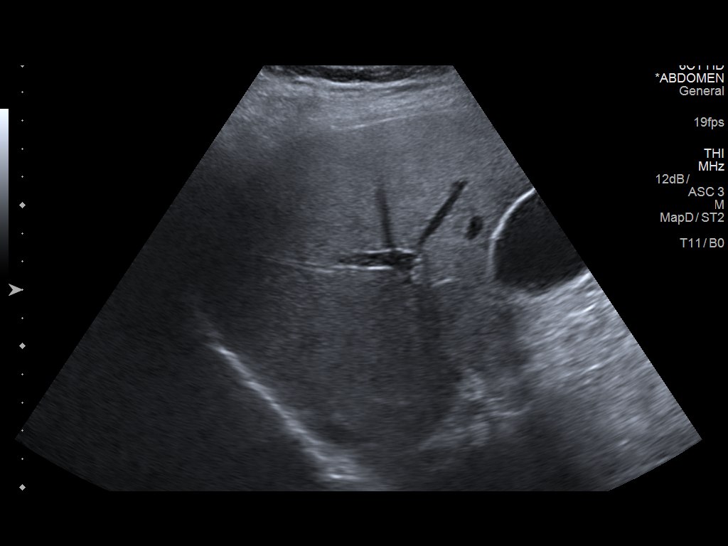
[im 43/103]
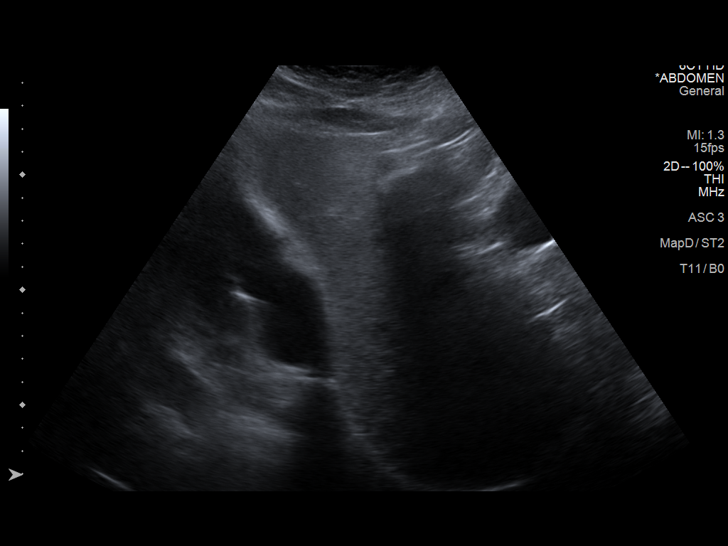
[im 52/103]
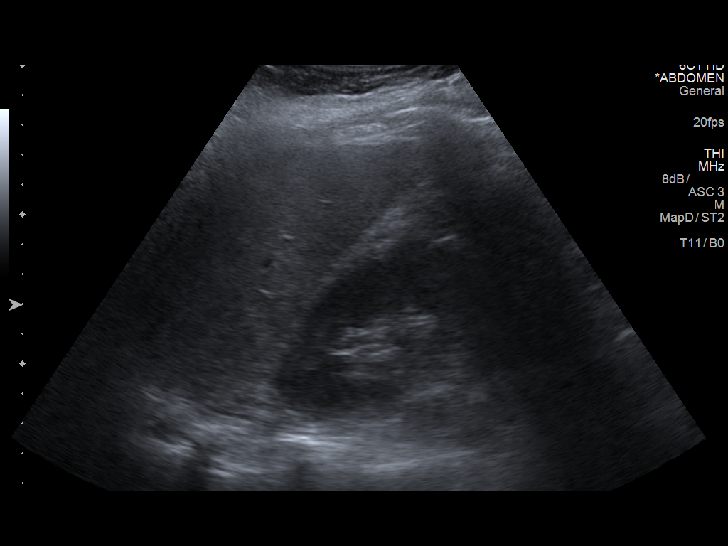
[im 60/103]
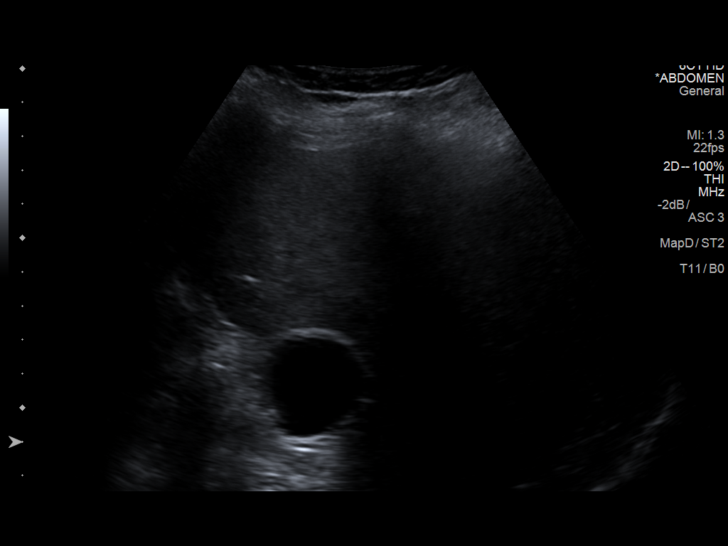
[im 69/103]
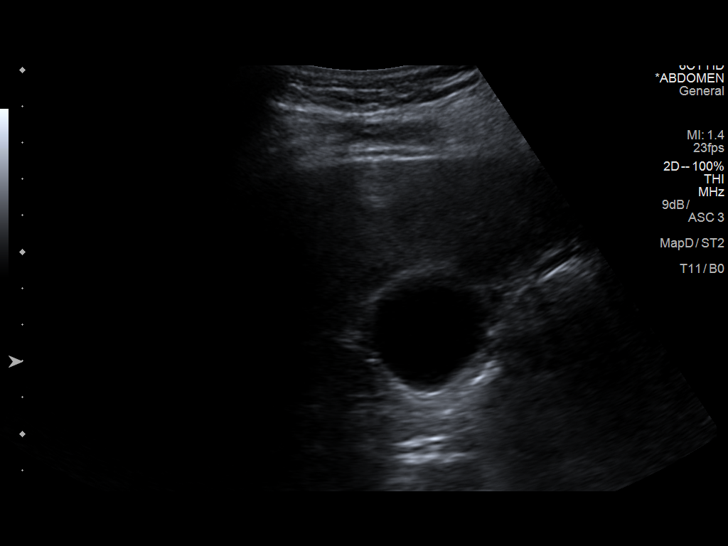
[im 77/103]
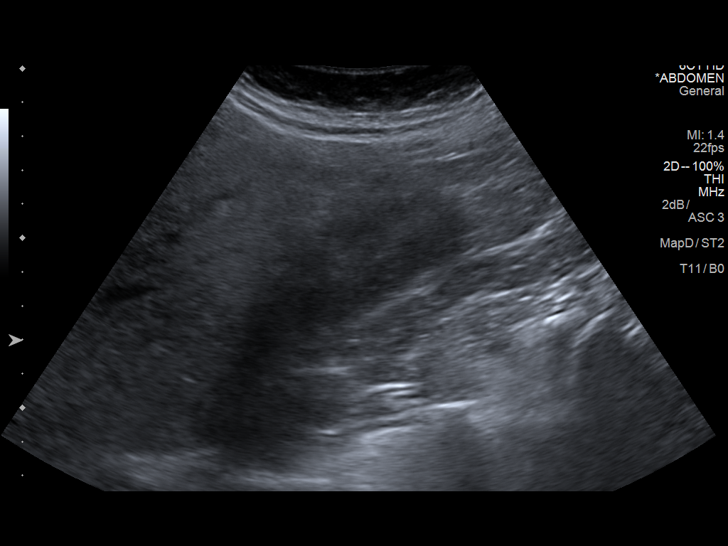
[im 86/103]
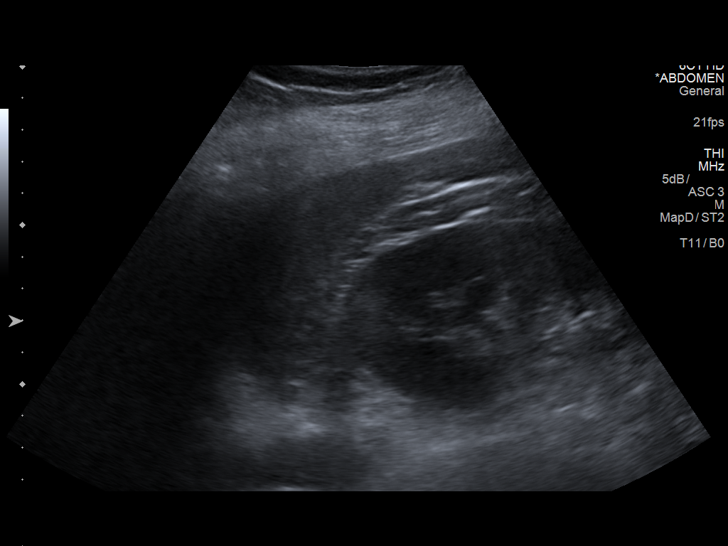
[im 94/103]
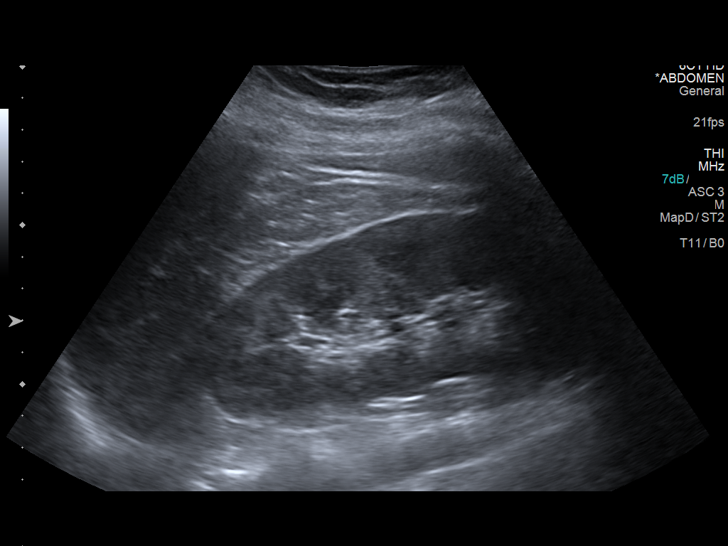
[im 103/103]
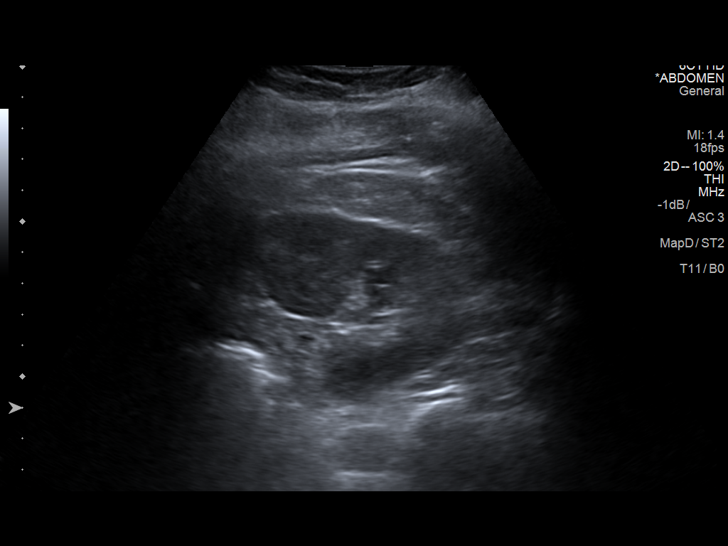

[13 of 25 positions shown; findings below may reference images not displayed]

FINDINGS: Gallbladder: No gallstones or wall thickening visualized. There is
no pericholecystic fluid. No sonographic Murphy sign noted by
sonographer.

Common bile duct: Diameter: 3 mm. No intrahepatic, common hepatic,
or common bile duct dilatation.

Liver: No focal lesion identified except for a presumed small
calcified granuloma in the left lobe of the liver. Liver
echogenicity overall is increased. Portal vein is patent on color
Doppler imaging with normal direction of blood flow towards the
liver.

IVC: No abnormality visualized.

Pancreas: No pancreatic mass or inflammatory focus.

Spleen: Size and appearance within normal limits.

Right Kidney: Length: 11.7 cm. Echogenicity within normal limits. No
mass or hydronephrosis visualized.

Left Kidney: Length: 11.7 cm. Echogenicity within normal limits. No
mass or hydronephrosis visualized.

Abdominal aorta: No aneurysm visualized.

Other findings: No demonstrable ascites.
IMPRESSION: Small calcified granuloma in left lobe of liver. Overall increase in
liver echogenicity, a finding felt to be indicative of hepatic
steatosis. While no focal liver lesions are identified beyond a
small calcified granuloma, it must be cautioned that the sensitivity
of ultrasound for detection of focal liver lesions is diminished in
this circumstance.

Study otherwise unremarkable.

## 2019-11-20 ENCOUNTER — Other Ambulatory Visit: Payer: Self-pay

## 2019-11-20 ENCOUNTER — Ambulatory Visit: Payer: Managed Care, Other (non HMO)

## 2019-11-20 VITALS — BP 127/86 | HR 94 | Resp 16

## 2019-11-20 DIAGNOSIS — I10 Essential (primary) hypertension: Secondary | ICD-10-CM

## 2019-11-20 NOTE — Progress Notes (Signed)
BP Readings from Last 3 Encounters:  04/25/19 (!) 149/77  03/19/19 (!) 152/96  06/03/18 133/82  BP was 110/80 at nurse visit on 08/20/19 with a pulse of 90. Patient here for bp reading per [name] FNP.  Currently takes Amlodipine 5 mg, 1 tablet by mouth daily.  Patient reports compliance with medication.  BP today:  127/86 HR:  94 Patient advised per Dr. Dola Factor continue same dose and she can come back after 03-18-2020 for annual physical.  Rechecked pulse at provider's request and it was 94.

## 2020-02-09 ENCOUNTER — Other Ambulatory Visit: Payer: Self-pay | Admitting: Family

## 2020-02-17 ENCOUNTER — Emergency Department (HOSPITAL_BASED_OUTPATIENT_CLINIC_OR_DEPARTMENT_OTHER)
Admission: EM | Admit: 2020-02-17 | Discharge: 2020-02-17 | Disposition: A | Discharge status: Home / Self Care | Payer: 59 | Attending: Emergency Medicine | Admitting: Emergency Medicine

## 2020-02-17 ENCOUNTER — Other Ambulatory Visit: Payer: Self-pay

## 2020-02-17 ENCOUNTER — Encounter (HOSPITAL_BASED_OUTPATIENT_CLINIC_OR_DEPARTMENT_OTHER): Payer: Self-pay

## 2020-02-17 ENCOUNTER — Encounter: Payer: Self-pay | Admitting: Family

## 2020-02-17 DIAGNOSIS — S39012A Strain of muscle, fascia and tendon of lower back, initial encounter: Secondary | ICD-10-CM | POA: Diagnosis not present

## 2020-02-17 DIAGNOSIS — Y999 Unspecified external cause status: Secondary | ICD-10-CM | POA: Insufficient documentation

## 2020-02-17 DIAGNOSIS — S7002XA Contusion of left hip, initial encounter: Secondary | ICD-10-CM | POA: Diagnosis not present

## 2020-02-17 DIAGNOSIS — Y92410 Unspecified street and highway as the place of occurrence of the external cause: Secondary | ICD-10-CM | POA: Insufficient documentation

## 2020-02-17 DIAGNOSIS — Y93I9 Activity, other involving external motion: Secondary | ICD-10-CM | POA: Insufficient documentation

## 2020-02-17 DIAGNOSIS — S3992XA Unspecified injury of lower back, initial encounter: Secondary | ICD-10-CM | POA: Diagnosis present

## 2020-02-17 DIAGNOSIS — Z79899 Other long term (current) drug therapy: Secondary | ICD-10-CM | POA: Insufficient documentation

## 2020-02-17 DIAGNOSIS — I10 Essential (primary) hypertension: Secondary | ICD-10-CM | POA: Insufficient documentation

## 2020-02-17 MED ORDER — IBUPROFEN 800 MG PO TABS: 800.0000 mg | ORAL_TABLET | Freq: Three times a day (TID) | ORAL | 0 refills | Status: DC | PRN

## 2020-02-17 MED ORDER — CYCLOBENZAPRINE HCL 10 MG PO TABS: 10.0000 mg | ORAL_TABLET | Freq: Three times a day (TID) | ORAL | 0 refills | Status: DC | PRN

## 2020-02-17 MED FILL — IBUPROFEN 800 MG TAB: 800 | 7 days supply | Qty: 21 | Fill #0

## 2020-02-17 MED FILL — CYCLOBENZAPRINE HCL 10 MG T: 10 | 5 days supply | Qty: 15 | Fill #0
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
## 2020-02-24 ENCOUNTER — Telehealth: Payer: Self-pay
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
## 2020-02-24 NOTE — Telephone Encounter (Signed)
Patient was scheduled to see Melissa tomorrow at 8 am

## 2020-02-25 ENCOUNTER — Other Ambulatory Visit: Payer: Self-pay

## 2020-02-25 ENCOUNTER — Encounter: Payer: Self-pay | Admitting: Family

## 2020-02-25 ENCOUNTER — Ambulatory Visit (INDEPENDENT_AMBULATORY_CARE_PROVIDER_SITE_OTHER): Payer: 59 | Admitting: Family

## 2020-02-25 VITALS — BP 150/93 | HR 87 | Temp 97.2°F | Resp 16 | Ht 64.0 in | Wt 210.0 lb

## 2020-02-25 DIAGNOSIS — M542 Cervicalgia: Secondary | ICD-10-CM | POA: Diagnosis not present

## 2020-02-25 DIAGNOSIS — I10 Essential (primary) hypertension: Secondary | ICD-10-CM | POA: Diagnosis not present

## 2020-02-25 MED ORDER — AMLODIPINE BESYLATE 10 MG PO TABS: 10.0000 mg | ORAL_TABLET | Freq: Every day | ORAL | 0 refills | Status: DC

## 2020-02-25 NOTE — Patient Instructions (Signed)
Please increase amlodipine from 5mg to 10mg once daily.  
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
## 2020-03-08 ENCOUNTER — Ambulatory Visit: Payer: 59 | Admitting: Family

## 2020-03-10 ENCOUNTER — Other Ambulatory Visit: Payer: Self-pay

## 2020-03-10 ENCOUNTER — Telehealth: Payer: 59 | Admitting: Family

## 2020-03-22 ENCOUNTER — Ambulatory Visit: Payer: 59 | Admitting: Family

## 2020-03-22 ENCOUNTER — Other Ambulatory Visit: Payer: Self-pay

## 2020-03-22 ENCOUNTER — Encounter: Payer: Self-pay | Admitting: Family

## 2020-03-22 VITALS — BP 123/80 | HR 83 | Temp 97.7°F | Resp 16 | Ht 64.0 in | Wt 206.0 lb

## 2020-03-22 DIAGNOSIS — I10 Essential (primary) hypertension: Secondary | ICD-10-CM | POA: Diagnosis not present

## 2020-03-22 DIAGNOSIS — R198 Other specified symptoms and signs involving the digestive system and abdomen: Secondary | ICD-10-CM | POA: Diagnosis not present
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
## 2020-05-13 ENCOUNTER — Other Ambulatory Visit: Payer: Self-pay | Admitting: Family

## 2020-06-23 ENCOUNTER — Encounter: Payer: 59 | Admitting: Family

## 2020-06-23 ENCOUNTER — Encounter: Payer: Self-pay | Admitting: Family

## 2020-08-04 ENCOUNTER — Other Ambulatory Visit: Payer: Self-pay | Admitting: Family

## 2020-10-11 ENCOUNTER — Other Ambulatory Visit: Payer: Self-pay

## 2020-10-11 ENCOUNTER — Ambulatory Visit (INDEPENDENT_AMBULATORY_CARE_PROVIDER_SITE_OTHER): Payer: 59 | Admitting: Family

## 2020-10-11 ENCOUNTER — Encounter: Payer: Self-pay | Admitting: Family

## 2020-10-11 ENCOUNTER — Other Ambulatory Visit (HOSPITAL_COMMUNITY)
Admission: RE | Admit: 2020-10-11 | Discharge: 2020-10-11 | Disposition: A | Discharge status: Home / Self Care | Payer: 59 | Source: Ambulatory Visit | Attending: Family | Admitting: Family

## 2020-10-11 VITALS — BP 115/75 | HR 76 | Temp 98.6°F | Resp 16 | Ht 64.0 in | Wt 200.0 lb

## 2020-10-11 DIAGNOSIS — Z23 Encounter for immunization: Secondary | ICD-10-CM | POA: Diagnosis not present

## 2020-10-11 DIAGNOSIS — R945 Abnormal results of liver function studies: Secondary | ICD-10-CM | POA: Diagnosis not present

## 2020-10-11 DIAGNOSIS — Z01419 Encounter for gynecological examination (general) (routine) without abnormal findings: Secondary | ICD-10-CM | POA: Diagnosis not present

## 2020-10-11 DIAGNOSIS — R7989 Other specified abnormal findings of blood chemistry: Secondary | ICD-10-CM

## 2020-10-11 DIAGNOSIS — Z0000 Encounter for general adult medical examination without abnormal findings: Secondary | ICD-10-CM

## 2020-10-11 NOTE — Progress Notes (Signed)
Subjective:    Patient ID: Colleen Roach, female    DOB: February 01, 1976, 44 y.o.   MRN: [id number]  HPI   Patient is a 44 yr old female who presents today for cpx.  Immunizations:  Td 2020, flu shot today, she covid Moderna x 2.  Diet: she is doing intermittent fasting.  She reports that she has lost 5-6 pounds in the last few weeks.  Wt Readings from Last 3 Encounters:  10/11/20 200 lb (90.7 kg)  03/22/20 206 lb (93.4 kg)  02/25/20 210 lb (95.3 kg)  Exercise: walks most days 62minutes Pap Smear: 4/18 Mammogram: due [facility]: 6/21  [facility] which include abnormals:  GGT 92, AST 53, ALT 149. She had an abdominal ultrasound 03/18/18 which showed hepatic steatosis.    Review of Systems  Constitutional: Negative for unexpected weight change.  HENT: Negative for hearing loss and rhinorrhea.   Eyes: Negative for visual disturbance.  Respiratory: Negative for cough.   Cardiovascular: Negative for chest pain.  Gastrointestinal: Negative for abdominal pain, constipation and diarrhea.  Genitourinary: Negative for dysuria and frequency.  Musculoskeletal: Negative for arthralgias and myalgias.  Skin: Negative for rash.  Neurological: Negative for headaches.  Hematological: Negative for adenopathy.  Psychiatric/Behavioral:       Father passed in October. She continues to grieve   Past Medical History:  Diagnosis Date  . Back pain   . Fatty liver   . Hypertension      Social History   Socioeconomic History  . Marital status: Married    Spouse name: Not on file  . Number of children: 2  . Years of education: Restaurant manager, fast food  . Highest education level: Not on file  Occupational History  . Occupation: Quarry manager  Tobacco Use  . Smoking status: Never Smoker  . Smokeless tobacco: Never Used  Vaping Use  . Vaping Use: Never used  Substance and Sexual Activity  . Alcohol use: Never  . Drug use: Never  . Sexual activity: Yes    Partners: Male    Birth  control/protection: I.U.D.    Comment: Mirena   Other Topics Concern  . 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  Family History  Problem Relation Age of Onset  . Hypertension Mother   . Hypertension Father   . Liver cancer Father     Allergies  Allergen Reactions  . Augmentin [Amoxicillin-Pot Clavulanate]     Mucous in throat and diarrhea  . Fish Allergy Swelling    Facial Swelling   . Ibuprofen     rash  . Pollen Extract   . Almond (Diagnostic) Rash  . Cashew Nut Oil Rash  . Eggs Or Egg-Derived Products Rash  . Naproxen Sodium Rash  . Peanut-Containing Drug Products Rash  . Shellfish Allergy Rash  Current Outpatient Medications on File Prior to Visit  Medication Sig Dispense Refill  . amLODipine (NORVASC) 10 MG tablet TAKE 1 TABLET BY MOUTH EVERY DAY 90 tablet 0  . Multiple Vitamins-Calcium (ONE-A-DAY WOMENS FORMULA) TABS One tab by mouth once daily 90 tablet 3  . loratadine (CLARITIN) 10 MG tablet Take 1 tablet (10 mg total) by mouth daily. (Patient not taking: Reported on 10/11/2020) 90 tablet 3   No current facility-administered medications on file prior to visit.    BP 115/75   Pulse 76   Temp 98.6 F (37 C) (Oral)   Resp 16   Ht 5\' 4"  (1.626 m)   Wt 200 lb (90.7 kg)   SpO2 100%   BMI 34.33 kg/m       Objective:   Physical Exam   Physical Exam  Constitutional: She is oriented to person, place, and  time. She appears well-developed and well-nourished. No distress.  HENT:  Head: Normocephalic and atraumatic.  Right Ear: Tympanic membrane and ear canal normal.  Left Ear: Tympanic membrane and ear canal normal.  Mouth/Throat: not examined- pt wearing mask Eyes: Pupils are equal, round, and reactive to light. No scleral icterus.  Neck: Normal range of motion. No thyromegaly present.  Cardiovascular: Normal rate and regular rhythm.   No murmur heard. Pulmonary/Chest: Effort normal and breath sounds normal. No respiratory distress. He has no wheezes. She has no rales. She exhibits no tenderness.  Abdominal: Soft. Bowel sounds are normal. She exhibits no distension and no mass. There is no tenderness. There is no rebound and no guarding.  Musculoskeletal: She exhibits no edema.  Lymphadenopathy:    She has no cervical adenopathy.  Neurological: She is alert and oriented to person, place, and time. She has normal patellar reflexes. She exhibits normal muscle tone. Coordination normal.  Skin: Skin is warm and dry.  Psychiatric: She has a normal mood and affect. Her behavior is normal. Judgment and thought content normal.  Breasts: Examined lying Right: Without masses, retractions, discharge or axillary adenopathy.  Left: Without masses, retractions, discharge or axillary adenopathy.  Inguinal/mons: Normal without inguinal adenopathy  External genitalia: Normal  BUS/Urethra/Skene's glands: Normal  Bladder: Normal  Vagina: Normal  Cervix: Normal  Uterus: normal in size, shape and contour. Midline and mobile  Adnexa/parametria:  Rt: Without masses or tenderness.  Lt: Without masses or tenderness.  Anus and perineum: Normal            Assessment & Plan:   Preventative care- discussed importance of healthy diet, exercise and weight loss.  Flu shot today.  Recommended that she get covid booster shot  6 months after second shot.  Refer for mammogram. Pap performed today. Initial DBP  was elevated, but repeat bp was normal.    Abnormal LFT- likely secondary to NASH based on findings of 2019 abdominal US. Discussed best treatment is low fat diet, exercise, weight loss.  Will also check a hepatitis panel.   This visit occurred during the SARS-CoV-2 public health emergency.  Safety protocols were in place, including screening questions prior to the visit, additional usage of staff PPE, and extensive cleaning of exam room while observing appropriate contact time as indicated for disinfecting solutions.           Assessment & Plan:

## 2020-10-11 NOTE — Patient Instructions (Signed)
Please complete lab work prior to leaving. Please continue your work on healthy diet, exercise and weight loss.   Nonalcoholic Fatty Liver Disease Diet, Adult Nonalcoholic fatty liver disease is a condition that causes fat to build up in and around the liver. The disease makes it harder for the liver to work the way that it should. Following a healthy diet can help to keep nonalcoholic fatty liver disease under control. It can also help to prevent or improve conditions that are associated with the disease, such as heart disease, diabetes, high blood pressure, and abnormal cholesterol levels. Along with regular exercise, this diet:  Promotes weight loss.  Helps to control blood sugar levels.  Helps to improve the way that the body uses insulin. What are tips for following this plan? Reading food labels Always check food labels for:  The amount of saturated fat in a food. You should limit your intake of saturated fat. Saturated fat is found in foods that come from animals, including meat and dairy products such as butter, cheese, and whole milk.  The amount of fiber in a food. You should choose high-fiber foods such as fruits, vegetables, and whole grains. Try to get 25-30 grams (g) of fiber a day.  Cooking  When cooking, use heart-healthy oils that are high in monounsaturated fats. These include olive oil, canola oil, and avocado oil.  Limit frying or deep-frying foods. Cook foods using healthy methods such as baking, boiling, steaming, and grilling instead. Meal planning  You may want to keep track of how many calories you take in. Eating the right amount of calories will help you achieve a healthy weight. Meeting with a registered dietitian can help you get started.  Limit how often you eat takeout and fast food. These foods are usually very high in fat, salt, and sugar.  Use the glycemic index (GI) to plan your meals. The index tells you how quickly a food will raise your blood  sugar. Choose low-GI foods (GI less than 55). These foods take a longer time to raise blood sugar. A registered dietitian can help you identify foods lower on the GI scale. Lifestyle  You may want to follow a Mediterranean diet. This diet includes a lot of vegetables, lean meats or fish, whole grains, fruits, and healthy oils and fats. What foods can I eat?  Fruits Bananas. Apples. Oranges. Grapes. Papaya. Mango. Pomegranate. Kiwi. Grapefruit. Cherries. Vegetables Lettuce. Spinach. Peas. Beets. Cauliflower. Cabbage. Broccoli. Carrots. Tomatoes. Squash. Eggplant. Herbs. Peppers. Onions. Cucumbers. Brussels sprouts. Yams and sweet potatoes. Beans. Lentils. Grains Whole wheat or whole-grain foods, including breads, crackers, cereals, and pasta. Stone-ground whole wheat. Unsweetened oatmeal. Bulgur. Barley. Quinoa. Brown or wild rice. Corn or whole wheat flour tortillas. Meats and other proteins Lean meats. Poultry. Tofu. Seafood and shellfish. Dairy Low-fat or fat-free dairy products, such as yogurt, cottage cheese, or cheese. Beverages Water. Sugar-free drinks. Tea. Coffee. Low-fat or skim milk. Milk alternatives, such as soy or almond milk. Real fruit juice. Fats and oils Avocado. Canola or olive oil. Nuts and nut butters. Seeds. Seasonings and condiments Mustard. Relish. Low-fat, low-sugar ketchup and barbecue sauce. Low-fat or fat-free mayonnaise. Sweets and desserts Sugar-free sweets. The items listed above may not be a complete list of foods and beverages you can eat. Contact a dietitian for more information. What foods should I limit or avoid? Meats and other proteins Limit red meat to 1-2 times a week. Dairy [employer]. Fats and oils Palm oil and coconut oil. [employer]  foods. Other foods Processed foods. Foods that contain a lot of salt or sodium. Sweets and desserts Sweets that contain sugar. Beverages Sweetened drinks, such as sweet tea, milkshakes, iced sweet drinks, and  sodas. Alcohol. The items listed above may not be a complete list of foods and beverages you should avoid. Contact a dietitian for more information. Where to find more information The [employer] of Diabetes and Digestive and Kidney Diseases: [employer] Summary  Nonalcoholic fatty liver disease is a condition that causes fat to build up in and around the liver.  Following a healthy diet can help to keep nonalcoholic fatty liver disease under control. Your diet should be rich in fruits, vegetables, whole grains, and lean proteins.  Limit your intake of saturated fat. Saturated fat is found in foods that come from animals, including meat and dairy products such as butter, cheese, and whole milk.  This diet promotes weight loss, helps to control blood sugar levels, and helps to improve the way that the body uses insulin. This information is not intended to replace advice given to you by your health care provider. Make sure you discuss any questions you have with your health care provider. Document Revised: 02/07/2019 Document Reviewed: 11/07/2018 Elsevier Patient Education  [place].

## 2020-10-12 LAB — HEPATIC FUNCTION PANEL
AG Ratio: 1.6 (calc) (ref 1.0–2.5)
ALT: 90 U/L — ABNORMAL HIGH (ref 6–29)
AST: 33 U/L — ABNORMAL HIGH (ref 10–30)
Albumin: 4.6 g/dL (ref 3.6–5.1)
Alkaline phosphatase (APISO): 62 U/L (ref 31–125)
Bilirubin, Direct: 0.1 mg/dL (ref 0.0–0.2)
Globulin: 2.8 g/dL (calc) (ref 1.9–3.7)
Indirect Bilirubin: 0.4 mg/dL (calc) (ref 0.2–1.2)
Total Bilirubin: 0.5 mg/dL (ref 0.2–1.2)
Total Protein: 7.4 g/dL (ref 6.1–8.1)

## 2020-10-12 LAB — HEPATITIS PANEL, ACUTE
Hep A IgM: NONREACTIVE
Hep B C IgM: NONREACTIVE
Hepatitis B Surface Ag: NONREACTIVE
Hepatitis C Ab: NONREACTIVE
SIGNAL TO CUT-OFF: 0.01 (ref <1.00)

## 2020-10-12 LAB — CYTOLOGY - PAP
Comment: NEGATIVE
Diagnosis: NEGATIVE
High risk HPV: NEGATIVE

## 2021-01-08 ENCOUNTER — Ambulatory Visit (HOSPITAL_BASED_OUTPATIENT_CLINIC_OR_DEPARTMENT_OTHER)
Admission: RE | Admit: 2021-01-08 | Discharge: 2021-01-08 | Disposition: A | Discharge status: Home / Self Care | Payer: 59 | Source: Ambulatory Visit | Attending: Family | Admitting: Family

## 2021-01-08 ENCOUNTER — Other Ambulatory Visit: Payer: Self-pay

## 2021-01-08 DIAGNOSIS — Z1231 Encounter for screening mammogram for malignant neoplasm of breast: Secondary | ICD-10-CM | POA: Diagnosis not present

## 2021-01-08 DIAGNOSIS — Z0000 Encounter for general adult medical examination without abnormal findings: Secondary | ICD-10-CM

## 2021-01-11 NOTE — Progress Notes (Signed)
Please let pt know that the radiologist would like her to complete some additional breast images for further evaluation. Let me know if she has not been contacted by them about a follow up appointment in 1 week.   

## 2021-01-12 ENCOUNTER — Other Ambulatory Visit: Payer: Self-pay | Admitting: Family

## 2021-01-12 DIAGNOSIS — R928 Other abnormal and inconclusive findings on diagnostic imaging of breast: Secondary | ICD-10-CM

## 2021-01-31 ENCOUNTER — Ambulatory Visit
Admission: RE | Admit: 2021-01-31 | Discharge: 2021-01-31 | Disposition: A | Discharge status: Home / Self Care | Payer: 59 | Source: Ambulatory Visit | Attending: Family | Admitting: Family

## 2021-01-31 ENCOUNTER — Other Ambulatory Visit: Payer: Self-pay

## 2021-01-31 ENCOUNTER — Other Ambulatory Visit: Payer: Self-pay | Admitting: Family

## 2021-01-31 DIAGNOSIS — R928 Other abnormal and inconclusive findings on diagnostic imaging of breast: Secondary | ICD-10-CM

## 2021-02-07 ENCOUNTER — Ambulatory Visit
Admission: RE | Admit: 2021-02-07 | Discharge: 2021-02-07 | Disposition: A | Discharge status: Home / Self Care | Payer: 59 | Source: Ambulatory Visit | Attending: Family | Admitting: Family

## 2021-02-07 ENCOUNTER — Other Ambulatory Visit: Payer: Self-pay

## 2021-02-07 DIAGNOSIS — R928 Other abnormal and inconclusive findings on diagnostic imaging of breast: Secondary | ICD-10-CM

## 2021-03-14 ENCOUNTER — Ambulatory Visit: Payer: Self-pay | Admitting: Surgery

## 2021-03-14 DIAGNOSIS — N6489 Other specified disorders of breast: Secondary | ICD-10-CM

## 2021-03-14 NOTE — H&P (Signed)
History of Present Illness Colleen Roach. Colleen Jamison MD; 03/14/2021 11:20 AM) The patient is a 45 year old Roach who presents with a breast mass.Referred by [name] NP for left breast mass  This is a 45 year old Colleen Roach in good health who presents with a recent screening mammogram that revealed an area of distortion in the left retroareolar region at 3:00. She underwent biopsy of this area which measured 6 mm in diameter. Pathology showed a complex sclerosing lesion. The patient denies any previous breast problems or breast surgery. She has no family history of breast cancer. 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   Problem List/Past Medical [name] 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 Claritin (10MG  Tablet, Oral) Active. Multivitamin (Oral) Active.  Social History Colleen Lorenzo, LPN; 2/40/9735 3:29 AM) Caffeine use Coffee, Tea. No alcohol use No drug use Tobacco use Never smoker.  Family History Colleen Lorenzo, LPN; 07/23/2682 4:19 AM) Cancer Father. Cerebrovascular Accident Father. Depression Father. Hypertension Father, Mother.  Pregnancy / Birth History Colleen Lorenzo, LPN; 04/20/2978 8:92 AM) Age at menarche 67 years. Contraceptive History Intrauterine device. Gravida 2 Irregular periods Length (months) of  breastfeeding 3-6 Maternal age 17-30 Para 2  Other Problems Colleen Roach. Colleen Lovick, MD; 03/14/2021 11:20 AM) Back Pain High blood pressure     Review of Systems [name] LPN; 11/17/4172 0:81 AM) General Not Present- Appetite Loss, Chills, Fatigue, Fever, Night Sweats, Weight Gain and Weight Loss. Skin Not Present- Change in Wart/Mole, Dryness, Hives, Jaundice, New Lesions, Non-Healing Wounds, Rash and Ulcer. HEENT Present- Seasonal Allergies. Not Present- Earache, Hearing Loss, Hoarseness, Nose Bleed, Oral Ulcers, Ringing in the Ears, Sinus Pain, Sore Throat, Visual Disturbances, Wears glasses/contact lenses and Yellow Eyes. Respiratory Present- Snoring. Not Present- Bloody sputum, Chronic Cough, Difficulty Breathing and Wheezing. Cardiovascular Present- Leg Cramps and Rapid Heart Rate. Not Present- Chest Pain, Difficulty Breathing Lying Down, Palpitations, Shortness of Breath and Swelling of Extremities. Gastrointestinal Not Present- Abdominal Pain, Bloating, Bloody Stool, Change in Bowel Habits, Chronic diarrhea, Constipation, Difficulty Swallowing, Excessive gas, Gets full quickly at meals, Hemorrhoids, Indigestion, Nausea, Rectal Pain and Vomiting. Roach Genitourinary Not Present- Frequency, Nocturia, Painful Urination, Pelvic Pain and Urgency. Musculoskeletal Present- Back Pain. Not Present- Joint Pain, Joint Stiffness, Muscle Pain, Muscle Weakness and Swelling of Extremities. Neurological Not Present- Decreased Memory, Fainting, Headaches, Numbness, Seizures, Tingling, Tremor, Trouble walking and Weakness. Psychiatric Not Present- Anxiety, Bipolar, Change in Sleep Pattern, Depression, Fearful and Frequent crying. Endocrine Not Present- Cold Intolerance, Excessive Hunger, Hair Changes, Heat Intolerance, Hot flashes and New Diabetes. Hematology Not Present- Blood Thinners, Easy Bruising, Excessive bleeding, Gland problems, HIV and Persistent Infections.  Vitals [name] LPN;  4/48/1856 3:14 AM) 03/14/2021 9:54 AM Weight: 194.2 lb Height: 64in Body Surface Area: 1.93 m Body Mass Index: 33.33 kg/m  Pulse: 92 (Regular)  BP: 124/80(Sitting, Left Arm, Standard)        Physical Exam [name] [name] MD; 03/14/2021 11:20 AM)  The physical exam findings are as follows: Note:Constitutional: WDWN  in NAD, conversant, no obvious deformities; resting comfortably Eyes: Pupils equal, round; sclera anicteric; moist conjunctiva; no lid lag HENT: Oral mucosa moist; good dentition Neck: No masses palpated, trachea midline; no thyromegaly Lungs: CTA bilaterally; normal respiratory effort Breasts: Symmetric, no palpable masses, no nipple changes or discharge, no axillary lymphadenopathy on either side. CV: Regular rate and rhythm; no murmurs; extremities well-perfused with no edema Abd: +bowel sounds, soft, non-tender, no palpable organomegaly; no palpable hernias Musc: Normal gait; no apparent clubbing or cyanosis in extremities Lymphatic: No palpable cervical or axillary lymphadenopathy Skin: Warm, dry; no sign of jaundice Psychiatric - alert and oriented x 4; calm mood and affect    Assessment & Plan [name] [name] MD; 03/14/2021 10:12 AM)  COMPLEX SCLEROSING LESION OF LEFT BREAST (N64.89)  Current Plans Schedule for Surgery - Left radioactive seed localized lumpectomy. The surgical procedure has been discussed with the patient. Potential risks, benefits, alternative treatments, and expected outcomes have been explained. All of the patient's questions at this time have been answered. The likelihood of reaching the patient's treatment goal is good. The patient understand the proposed surgical procedure and wishes to proceed.  Colleen Roach. [name], MD, [facility]  General/ Trauma Surgery   03/14/2021 11:20 AM

## 2021-03-15 ENCOUNTER — Other Ambulatory Visit: Payer: Self-pay | Admitting: Surgery

## 2021-03-15 DIAGNOSIS — N6489 Other specified disorders of breast: Secondary | ICD-10-CM

## 2021-04-04 ENCOUNTER — Ambulatory Visit: Payer: 59 | Admitting: Family

## 2021-04-04 ENCOUNTER — Other Ambulatory Visit: Payer: Self-pay

## 2021-04-04 ENCOUNTER — Encounter: Payer: Self-pay | Admitting: Family

## 2021-04-04 VITALS — BP 129/77 | HR 69 | Temp 98.3°F | Resp 16 | Wt 195.0 lb

## 2021-04-04 DIAGNOSIS — I10 Essential (primary) hypertension: Secondary | ICD-10-CM | POA: Diagnosis not present

## 2021-04-04 DIAGNOSIS — J302 Other seasonal allergic rhinitis: Secondary | ICD-10-CM | POA: Diagnosis not present

## 2021-04-04 DIAGNOSIS — K760 Fatty (change of) liver, not elsewhere classified: Secondary | ICD-10-CM

## 2021-04-04 NOTE — Assessment & Plan Note (Signed)
Currently stable, not needing claritin.  Monitor. I did recommend that she get the COVID Booster shot.

## 2021-04-04 NOTE — Assessment & Plan Note (Signed)
BP is at goal. Continue amlodipine 10mg .  Obtain CMET.

## 2021-04-04 NOTE — Assessment & Plan Note (Signed)
Hx of abnormal LFT's, repeat LFT.

## 2021-04-04 NOTE — Patient Instructions (Signed)
Please complete lab work prior to leaving. Please get your covid booster shot.

## 2021-04-04 NOTE — Progress Notes (Signed)
Subjective:   By signing my name below, I, Shehryar Baig, attest that this documentation has been prepared under the direction and in the presence of [name] NP. 04/04/2021    Patient ID: Colleen Roach, female    DOB: 1976-09-09, 45 y.o.   MRN: [id number]  Chief Complaint  Patient presents with  . Hypertension    Here for follow up  . Foot Pain    Patient complains of occasional left heel pain    HPI Patient is in today for a office visit. She complains of a sharp pain in her left heel after waking up in the morning. She has 2 Covid-19 vaccines.  Hypertension- Her blood pressure mearsured 179/77 during today's visit. She continues taking 10 mg amlodipine daily PO. She complains of getting sleepy after taking the 10 mg amlodipine PO. BP Readings from Last 3 Encounters:  04/04/21 129/77  10/11/20 115/75  03/22/20 123/80    Allergy- She continues taking 10 mg Claritin PO when her allergy symptoms flare up.   Health Maintenance Due  Topic Date Due  . COVID-19 Vaccine (3 - Booster for Moderna series) 06/29/2020    Past Medical History:  Diagnosis Date  . Back pain   . Fatty liver   . Hypertension     No past surgical history on file.  Family History  Problem Relation Age of Onset  . Hypertension Mother   . Hypertension Father   . Liver cancer Father     Social History   Socioeconomic History  . Marital status: Married    Spouse name: Not on file  . Number of children: 2  . Years of education: Restaurant manager, fast food  . Highest education level: Not on file  Occupational History  . Occupation: Quarry manager  Tobacco Use  . Smoking status: Never Smoker  . Smokeless tobacco: Never Used  Vaping Use  . Vaping Use: Never used  Substance and Sexual Activity  . Alcohol use: Never  . Drug use: Never  . Sexual activity: Yes    Partners: Male    Birth control/protection: I.U.D.    Comment: Mirena   Other Topics Concern  . 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 amLODipine (NORVASC) 10 MG tablet TAKE 1 TABLET BY MOUTH EVERY DAY 90 tablet 0  . loratadine (CLARITIN) 10 MG tablet Take 1 tablet (10 mg total) by mouth daily. 90 tablet 3  . Multiple Vitamins-Calcium (ONE-A-DAY WOMENS FORMULA) TABS One tab by mouth once daily 90 tablet 3   No facility-administered medications prior to visit.    Allergies  Allergen Reactions  . Augmentin [Amoxicillin-Pot Clavulanate]     Mucous in throat and diarrhea  . Fish Allergy Swelling    Facial Swelling   . Ibuprofen     rash  . Pollen  Extract   . Almond (Diagnostic) Rash  . Cashew Nut Oil Rash  . Eggs Or Egg-Derived Products Rash  . Naproxen Sodium Rash  . Peanut-Containing Drug Products Rash  . Shellfish Allergy Rash    Review of Systems  Musculoskeletal: Positive for myalgias (Left heel).       Objective:    Physical Exam Constitutional:      General: She is not in acute distress.    Appearance: Normal appearance. She is not ill-appearing.  HENT:     Head: Normocephalic and atraumatic.     Right Ear: External ear normal.     Left Ear: External ear normal.  Eyes:     Extraocular Movements: Extraocular movements intact.     Pupils: Pupils are equal, round, and reactive to light.  Cardiovascular:     Rate and Rhythm: Normal rate and regular rhythm.     Pulses: Normal pulses.     Heart sounds: Normal heart  sounds. No murmur heard. No gallop.   Pulmonary:     Effort: Pulmonary effort is normal. No respiratory distress.     Breath sounds: Normal breath sounds. No wheezing, rhonchi or rales.  Skin:    General: Skin is warm and dry.  Neurological:     Mental Status: She is alert and oriented to person, place, and time.  Psychiatric:        Behavior: Behavior normal.     BP 129/77 (BP Location: Right Arm, Patient Position: Sitting, Cuff Size: Large)   Pulse 69   Temp 98.3 F (36.8 C) (Oral)   Resp 16   Wt 195 lb (88.5 kg)   SpO2 100%   BMI 33.47 kg/m  Wt Readings from Last 3 Encounters:  04/04/21 195 lb (88.5 kg)  10/11/20 200 lb (90.7 kg)  03/22/20 206 lb (93.4 kg)       Assessment & Plan:   Problem List Items Addressed This Visit      Unprioritized   Seasonal allergies    Currently stable, not needing claritin.  Monitor. I did recommend that she get the COVID Booster shot.       Fatty liver - Primary    Hx of abnormal LFT's, repeat LFT.      Relevant Orders   Comp Met (CMET)   Essential hypertension    BP is at goal. Continue amlodipine 52m.  Obtain CMET.      Relevant Orders   Comp Met (CMET)       No orders of the defined types were placed in this encounter.   I, MDebbrah AlarNP, personally preformed the services described in this documentation.  All medical record entries made by the scribe were at my direction and in my presence.  I have reviewed the chart and discharge instructions (if applicable) and agree that the record reflects my personal performance and is accurate and complete. 04/04/2021   I,Shehryar Baig,acting as a scribe for MNance Pear NP.,have documented all relevant documentation on the behalf of MNance Pear NP,as directed by  MNance Pear NP while in the presence of MNance Pear NP.   MNance Pear NP

## 2021-04-05 LAB — COMPREHENSIVE METABOLIC PANEL
AG Ratio: 1.5 (calc) (ref 1.0–2.5)
ALT: 40 U/L — ABNORMAL HIGH (ref 6–29)
AST: 18 U/L (ref 10–30)
Albumin: 4.1 g/dL (ref 3.6–5.1)
Alkaline phosphatase (APISO): 49 U/L (ref 31–125)
BUN: 12 mg/dL (ref 7–25)
CO2: 24 mmol/L (ref 20–32)
Calcium: 9 mg/dL (ref 8.6–10.2)
Chloride: 103 mmol/L (ref 98–110)
Creat: 0.59 mg/dL (ref 0.50–1.10)
Globulin: 2.7 g/dL (calc) (ref 1.9–3.7)
Glucose, Bld: 94 mg/dL (ref 65–99)
Potassium: 4.1 mmol/L (ref 3.5–5.3)
Sodium: 138 mmol/L (ref 135–146)
Total Bilirubin: 0.6 mg/dL (ref 0.2–1.2)
Total Protein: 6.8 g/dL (ref 6.1–8.1)

## 2021-04-09 ENCOUNTER — Other Ambulatory Visit: Payer: Self-pay | Admitting: Family

## 2021-04-11 ENCOUNTER — Other Ambulatory Visit: Payer: Self-pay | Admitting: Family

## 2021-04-11 ENCOUNTER — Ambulatory Visit: Payer: 59 | Admitting: Family

## 2021-04-12 ENCOUNTER — Other Ambulatory Visit: Payer: Self-pay | Admitting: Family

## 2021-04-12 MED ORDER — AMLODIPINE BESYLATE 10 MG PO TABS: 1.0000 | ORAL_TABLET | Freq: Every day | ORAL | 1 refills | Status: DC

## 2021-05-16 ENCOUNTER — Other Ambulatory Visit: Payer: Self-pay

## 2021-05-16 ENCOUNTER — Encounter (HOSPITAL_BASED_OUTPATIENT_CLINIC_OR_DEPARTMENT_OTHER): Payer: Self-pay | Admitting: Surgery

## 2021-05-23 ENCOUNTER — Other Ambulatory Visit: Payer: Self-pay

## 2021-05-23 ENCOUNTER — Ambulatory Visit
Admission: RE | Admit: 2021-05-23 | Discharge: 2021-05-23 | Disposition: A | Discharge status: Home / Self Care | Payer: 59 | Source: Ambulatory Visit | Attending: Surgery | Admitting: Surgery

## 2021-05-23 DIAGNOSIS — N6489 Other specified disorders of breast: Secondary | ICD-10-CM

## 2021-05-23 NOTE — Anesthesia Preprocedure Evaluation (Addendum)
Anesthesia Evaluation  Patient identified by MRN, date of birth, ID band Patient awake    Reviewed: Allergy & Precautions, H&P , NPO status , Patient's Chart, lab work & pertinent test results  Airway Mallampati: I  TM Distance: >3 FB Neck ROM: Full    Dental no notable dental hx. (+) Missing, Teeth Intact, Dental Advisory Given,    Pulmonary neg pulmonary ROS,    Pulmonary exam normal breath sounds clear to auscultation       Cardiovascular Exercise Tolerance: Good hypertension, Pt. on medications Normal cardiovascular exam Rhythm:Regular Rate:Normal     Neuro/Psych negative neurological ROS  negative psych ROS   GI/Hepatic negative GI ROS, Neg liver ROS,   Endo/Other  Morbid obesity  Renal/GU negative Renal ROS  negative genitourinary   Musculoskeletal negative musculoskeletal ROS (+)   Abdominal   Peds negative pediatric ROS (+)  Hematology negative hematology ROS (+)   Anesthesia Other Findings   Reproductive/Obstetrics negative OB ROS                            Anesthesia Physical Anesthesia Plan  ASA: 2  Anesthesia Plan: General   Post-op Pain Management:    Induction: Intravenous  PONV Risk Score and Plan: 3 and Ondansetron, Dexamethasone and Midazolam  Airway Management Planned: LMA  Additional Equipment: None  Intra-op Plan:   Post-operative Plan: Extubation in OR  Informed Consent: I have reviewed the patients History and Physical, chart, labs and discussed the procedure including the risks, benefits and alternatives for the proposed anesthesia with the patient or authorized representative who has indicated his/her understanding and acceptance.       Plan Discussed with: CRNA and Anesthesiologist  Anesthesia Plan Comments: ( )        Anesthesia Quick Evaluation

## 2021-05-24 ENCOUNTER — Ambulatory Visit
Admission: RE | Admit: 2021-05-24 | Discharge: 2021-05-24 | Disposition: A | Discharge status: Home / Self Care | Payer: 59 | Source: Ambulatory Visit | Attending: Surgery | Admitting: Surgery

## 2021-05-24 ENCOUNTER — Encounter (HOSPITAL_BASED_OUTPATIENT_CLINIC_OR_DEPARTMENT_OTHER): Payer: Self-pay | Admitting: Surgery

## 2021-05-24 ENCOUNTER — Ambulatory Visit (HOSPITAL_BASED_OUTPATIENT_CLINIC_OR_DEPARTMENT_OTHER): Payer: 59 | Admitting: Anesthesiology

## 2021-05-24 ENCOUNTER — Other Ambulatory Visit: Payer: Self-pay | Admitting: Family

## 2021-05-24 ENCOUNTER — Encounter (HOSPITAL_BASED_OUTPATIENT_CLINIC_OR_DEPARTMENT_OTHER)
Admission: RE | Disposition: A | Discharge status: Home / Self Care | Payer: Self-pay | Source: Home / Self Care | Attending: Surgery

## 2021-05-24 ENCOUNTER — Ambulatory Visit (HOSPITAL_BASED_OUTPATIENT_CLINIC_OR_DEPARTMENT_OTHER)
Admission: RE | Admit: 2021-05-24 | Discharge: 2021-05-24 | Disposition: A | Discharge status: Home / Self Care | Payer: 59 | Attending: Surgery | Admitting: Surgery

## 2021-05-24 DIAGNOSIS — Z886 Allergy status to analgesic agent status: Secondary | ICD-10-CM | POA: Diagnosis not present

## 2021-05-24 DIAGNOSIS — N6489 Other specified disorders of breast: Secondary | ICD-10-CM | POA: Diagnosis present

## 2021-05-24 DIAGNOSIS — D0512 Intraductal carcinoma in situ of left breast: Secondary | ICD-10-CM | POA: Diagnosis not present

## 2021-05-24 DIAGNOSIS — Z79899 Other long term (current) drug therapy: Secondary | ICD-10-CM | POA: Diagnosis not present

## 2021-05-24 DIAGNOSIS — D242 Benign neoplasm of left breast: Secondary | ICD-10-CM | POA: Diagnosis not present

## 2021-05-24 DIAGNOSIS — C50919 Malignant neoplasm of unspecified site of unspecified female breast: Secondary | ICD-10-CM

## 2021-05-24 DIAGNOSIS — Z880 Allergy status to penicillin: Secondary | ICD-10-CM | POA: Insufficient documentation

## 2021-05-24 HISTORY — PX: BREAST LUMPECTOMY WITH RADIOACTIVE SEED LOCALIZATION: SHX6424

## 2021-05-24 HISTORY — DX: Malignant neoplasm of unspecified site of unspecified female breast: C50.919

## 2021-05-24 LAB — POCT PREGNANCY, URINE: Preg Test, Ur: NEGATIVE

## 2021-05-24 SURGERY — BREAST LUMPECTOMY WITH RADIOACTIVE SEED LOCALIZATION
Anesthesia: General | Site: Breast | Laterality: Left

## 2021-05-24 MED ORDER — ACETAMINOPHEN 160 MG/5ML PO SOLN: 325.0000 mg | ORAL | Status: DC | PRN

## 2021-05-24 MED ORDER — CHLORHEXIDINE GLUCONATE CLOTH 2 % EX PADS: 6.0000 | MEDICATED_PAD | Freq: Once | CUTANEOUS | Status: DC

## 2021-05-24 MED ORDER — OXYCODONE HCL 5 MG PO TABS
5.0000 mg | ORAL_TABLET | Freq: Once | ORAL | Status: AC | PRN
  Administered 2021-05-24: 5 mg via ORAL

## 2021-05-24 MED ORDER — LIDOCAINE HCL (PF) 2 % IJ SOLN
INTRAMUSCULAR | Status: AC
  Filled 2021-05-24: qty 30

## 2021-05-24 MED ORDER — CEFAZOLIN SODIUM-DEXTROSE 2-4 GM/100ML-% IV SOLN
INTRAVENOUS | Status: AC
  Filled 2021-05-24: qty 100

## 2021-05-24 MED ORDER — ACETAMINOPHEN 325 MG PO TABS: 325.0000 mg | ORAL_TABLET | ORAL | Status: DC | PRN

## 2021-05-24 MED ORDER — DEXAMETHASONE SODIUM PHOSPHATE 4 MG/ML IJ SOLN
INTRAMUSCULAR | Status: DC | PRN
  Administered 2021-05-24: 4 mg via INTRAVENOUS

## 2021-05-24 MED ORDER — FENTANYL CITRATE (PF) 100 MCG/2ML IJ SOLN
INTRAMUSCULAR | Status: DC | PRN
  Administered 2021-05-24 (×2): 50 ug via INTRAVENOUS

## 2021-05-24 MED ORDER — MEPERIDINE HCL 25 MG/ML IJ SOLN: 6.2500 mg | INTRAMUSCULAR | Status: DC | PRN

## 2021-05-24 MED ORDER — ONDANSETRON HCL 4 MG/2ML IJ SOLN
INTRAMUSCULAR | Status: DC | PRN
  Administered 2021-05-24: 4 mg via INTRAVENOUS

## 2021-05-24 MED ORDER — LIDOCAINE HCL (CARDIAC) PF 100 MG/5ML IV SOSY
PREFILLED_SYRINGE | INTRAVENOUS | Status: DC | PRN
  Administered 2021-05-24: 60 mg via INTRAVENOUS

## 2021-05-24 MED ORDER — FENTANYL CITRATE (PF) 100 MCG/2ML IJ SOLN
INTRAMUSCULAR | Status: AC
  Filled 2021-05-24: qty 2

## 2021-05-24 MED ORDER — ACETAMINOPHEN 500 MG PO TABS
ORAL_TABLET | ORAL | Status: AC
  Filled 2021-05-24: qty 2

## 2021-05-24 MED ORDER — ACETAMINOPHEN 500 MG PO TABS
1000.0000 mg | ORAL_TABLET | ORAL | Status: AC
  Administered 2021-05-24: 1000 mg via ORAL

## 2021-05-24 MED ORDER — FENTANYL CITRATE (PF) 100 MCG/2ML IJ SOLN: 25.0000 ug | INTRAMUSCULAR | Status: DC | PRN

## 2021-05-24 MED ORDER — PROPOFOL 10 MG/ML IV BOLUS
INTRAVENOUS | Status: DC | PRN
  Administered 2021-05-24: 150 mg via INTRAVENOUS

## 2021-05-24 MED ORDER — ONDANSETRON HCL 4 MG/2ML IJ SOLN: 4.0000 mg | Freq: Once | INTRAMUSCULAR | Status: DC | PRN

## 2021-05-24 MED ORDER — LACTATED RINGERS IV SOLN: INTRAVENOUS | Status: DC

## 2021-05-24 MED ORDER — MIDAZOLAM HCL 5 MG/5ML IJ SOLN
INTRAMUSCULAR | Status: DC | PRN
  Administered 2021-05-24: 2 mg via INTRAVENOUS

## 2021-05-24 MED ORDER — CEFAZOLIN SODIUM-DEXTROSE 2-4 GM/100ML-% IV SOLN
2.0000 g | INTRAVENOUS | Status: AC
  Administered 2021-05-24: 2 g via INTRAVENOUS

## 2021-05-24 MED ORDER — BUPIVACAINE-EPINEPHRINE (PF) 0.25% -1:200000 IJ SOLN
INTRAMUSCULAR | Status: AC
  Filled 2021-05-24: qty 30

## 2021-05-24 MED ORDER — ONDANSETRON HCL 4 MG/2ML IJ SOLN
INTRAMUSCULAR | Status: AC
  Filled 2021-05-24: qty 14

## 2021-05-24 MED ORDER — MIDAZOLAM HCL 2 MG/2ML IJ SOLN
INTRAMUSCULAR | Status: AC
  Filled 2021-05-24: qty 2

## 2021-05-24 MED ORDER — PROPOFOL 500 MG/50ML IV EMUL
INTRAVENOUS | Status: DC | PRN
  Administered 2021-05-24: 25 ug/kg/min via INTRAVENOUS

## 2021-05-24 MED ORDER — OXYCODONE HCL 5 MG PO TABS
ORAL_TABLET | ORAL | Status: AC
  Filled 2021-05-24: qty 1

## 2021-05-24 MED ORDER — BUPIVACAINE-EPINEPHRINE 0.25% -1:200000 IJ SOLN
INTRAMUSCULAR | Status: DC | PRN
  Administered 2021-05-24: 10 mL

## 2021-05-24 MED ORDER — OXYCODONE HCL 5 MG/5ML PO SOLN
5.0000 mg | Freq: Once | ORAL | Status: AC | PRN
Start: 2021-05-24 — End: 2021-05-24

## 2021-05-24 MED ORDER — DEXAMETHASONE SODIUM PHOSPHATE 10 MG/ML IJ SOLN
INTRAMUSCULAR | Status: AC
  Filled 2021-05-24: qty 2

## 2021-05-24 MED ORDER — 0.9 % SODIUM CHLORIDE (POUR BTL) OPTIME
TOPICAL | Status: DC | PRN
  Administered 2021-05-24: 1000 mL

## 2021-05-24 SURGICAL SUPPLY — 50 items
APL PRP STRL LF DISP 70% ISPRP (MISCELLANEOUS) ×1
APL SKNCLS STERI-STRIP NONHPOA (GAUZE/BANDAGES/DRESSINGS) ×1
APPLIER CLIP 9.375 MED OPEN (MISCELLANEOUS) ×2
APR CLP MED 9.3 20 MLT OPN (MISCELLANEOUS) ×1
BENZOIN TINCTURE PRP APPL 2/3 (GAUZE/BANDAGES/DRESSINGS) ×2 IMPLANT
BLADE HEX COATED 2.75 (ELECTRODE) ×2 IMPLANT
BLADE SURG 15 STRL LF DISP TIS (BLADE) ×1 IMPLANT
BLADE SURG 15 STRL SS (BLADE) ×2
CANISTER SUC SOCK COL 7IN (MISCELLANEOUS) IMPLANT
CANISTER SUCT 1200ML W/VALVE (MISCELLANEOUS) ×2 IMPLANT
CHLORAPREP W/TINT 26 (MISCELLANEOUS) ×2 IMPLANT
CLIP APPLIE 9.375 MED OPEN (MISCELLANEOUS) ×1 IMPLANT
COVER BACK TABLE 60X90IN (DRAPES) ×2 IMPLANT
COVER MAYO STAND STRL (DRAPES) ×2 IMPLANT
COVER PROBE W GEL 5X96 (DRAPES) ×2 IMPLANT
DECANTER SPIKE VIAL GLASS SM (MISCELLANEOUS) IMPLANT
DRAPE LAPAROTOMY 100X72 PEDS (DRAPES) ×2 IMPLANT
DRAPE UTILITY XL STRL (DRAPES) ×2 IMPLANT
DRSG TEGADERM 4X4.75 (GAUZE/BANDAGES/DRESSINGS) ×2 IMPLANT
ELECT REM PT RETURN 9FT ADLT (ELECTROSURGICAL) ×2
ELECTRODE REM PT RTRN 9FT ADLT (ELECTROSURGICAL) ×1 IMPLANT
GAUZE SPONGE 4X4 12PLY STRL LF (GAUZE/BANDAGES/DRESSINGS) IMPLANT
GLOVE SURG ENC MOIS LTX SZ7 (GLOVE) ×2 IMPLANT
GLOVE SURG POLYISO LF SZ7 (GLOVE) ×2 IMPLANT
GLOVE SURG UNDER POLY LF SZ7 (GLOVE) ×4 IMPLANT
GLOVE SURG UNDER POLY LF SZ7.5 (GLOVE) ×2 IMPLANT
GOWN STRL REUS W/ TWL LRG LVL3 (GOWN DISPOSABLE) ×2 IMPLANT
GOWN STRL REUS W/TWL LRG LVL3 (GOWN DISPOSABLE) ×4
ILLUMINATOR WAVEGUIDE N/F (MISCELLANEOUS) IMPLANT
KIT MARKER MARGIN INK (KITS) ×2 IMPLANT
LIGHT WAVEGUIDE WIDE FLAT (MISCELLANEOUS) IMPLANT
NEEDLE HYPO 25X1 1.5 SAFETY (NEEDLE) ×2 IMPLANT
NS IRRIG 1000ML POUR BTL (IV SOLUTION) ×2 IMPLANT
PACK BASIN DAY SURGERY FS (CUSTOM PROCEDURE TRAY) ×2 IMPLANT
PENCIL SMOKE EVACUATOR (MISCELLANEOUS) ×2 IMPLANT
SLEEVE SCD COMPRESS KNEE MED (STOCKING) ×2 IMPLANT
SPONGE GAUZE 2X2 8PLY STRL LF (GAUZE/BANDAGES/DRESSINGS) IMPLANT
SPONGE T-LAP 18X18 ~~LOC~~+RFID (SPONGE) IMPLANT
SPONGE T-LAP 4X18 ~~LOC~~+RFID (SPONGE) ×2 IMPLANT
STRIP CLOSURE SKIN 1/2X4 (GAUZE/BANDAGES/DRESSINGS) ×2 IMPLANT
SUT MON AB 4-0 PC3 18 (SUTURE) ×2 IMPLANT
SUT SILK 2 0 SH (SUTURE) IMPLANT
SUT VIC AB 3-0 SH 27 (SUTURE) ×2
SUT VIC AB 3-0 SH 27X BRD (SUTURE) ×1 IMPLANT
SYR BULB EAR ULCER 3OZ GRN STR (SYRINGE) IMPLANT
SYR CONTROL 10ML LL (SYRINGE) ×2 IMPLANT
TOWEL GREEN STERILE FF (TOWEL DISPOSABLE) ×2 IMPLANT
TRAY FAXITRON CT DISP (TRAY / TRAY PROCEDURE) ×2 IMPLANT
TUBE CONNECTING 20X1/4 (TUBING) ×2 IMPLANT
YANKAUER SUCT BULB TIP NO VENT (SUCTIONS) IMPLANT

## 2021-05-24 NOTE — Anesthesia Procedure Notes (Signed)
Procedure Name: LMA Insertion Date/Time: 05/24/2021 7:32 AM Performed by: Ryann Leavitt, Ernesta Amble, CRNA Pre-anesthesia Checklist: Patient identified, Emergency Drugs available, Suction available and Patient being monitored Patient Re-evaluated:Patient Re-evaluated prior to induction Oxygen Delivery Method: Circle System Utilized Preoxygenation: Pre-oxygenation with 100% oxygen Induction Type: IV induction Ventilation: Mask ventilation without difficulty LMA: LMA inserted LMA Size: 4.0 Number of attempts: 1 Airway Equipment and Method: bite block Placement Confirmation: positive ETCO2 Tube secured with: Tape Dental Injury: Teeth and Oropharynx as per pre-operative assessment

## 2021-05-24 NOTE — Telephone Encounter (Signed)
Please send rx for '10mg'$  PO daily instead of '5mg'$ .  #90 with 1 refill.

## 2021-05-24 NOTE — Transfer of Care (Signed)
Immediate Anesthesia Transfer of Care Note  Patient: Colleen Roach  Procedure(s) Performed: LEFT BREAST LUMPECTOMY WITH RADIOACTIVE SEED LOCALIZATION (Left: Breast)  Patient Location: PACU  Anesthesia Type:General  Level of Consciousness: drowsy and patient cooperative  Airway & Oxygen Therapy: Patient Spontanous Breathing and Patient connected to face mask oxygen  Post-op Assessment: Report given to RN and Post -op Vital signs reviewed and stable  Post vital signs: Reviewed and stable  Last Vitals:  Vitals Value Taken Time  BP    Temp    Pulse 74 05/24/21 0829  Resp 14 05/24/21 0829  SpO2 96 % 05/24/21 0829  Vitals shown include unvalidated device data.  Last Pain:  Vitals:   05/24/21 0627  TempSrc: Oral  PainSc: 0-No pain      Patients Stated Pain Goal: 3 ([id number])  Complications: No notable events documented.

## 2021-05-24 NOTE — Discharge Instructions (Addendum)
[place] Office Phone Number [phone]  BREAST BIOPSY/ PARTIAL MASTECTOMY: POST OP INSTRUCTIONS  Always review your discharge instruction sheet given to you by the facility where your surgery was performed.  IF YOU HAVE DISABILITY OR FAMILY LEAVE FORMS, YOU MUST BRING THEM TO THE OFFICE FOR PROCESSING.  DO NOT GIVE THEM TO YOUR DOCTOR.  A prescription for pain medication may be given to you upon discharge.  Take your pain medication as prescribed, if needed.  If narcotic pain medicine is not needed, then you may take acetaminophen (Tylenol) or ibuprofen (Advil) as needed. Take your usually prescribed medications unless otherwise directed If you need a refill on your pain medication, please contact your pharmacy.  They will contact our office to request authorization.  Prescriptions will not be filled after 5pm or on week-ends. You should eat very light the first 24 hours after surgery, such as soup, crackers, pudding, etc.  Resume your normal diet the day after surgery. Most patients will experience some swelling and bruising in the breast.  Ice packs and a good support bra will help.  Swelling and bruising can take several days to resolve.  It is common to experience some constipation if taking pain medication after surgery.  Increasing fluid intake and taking a stool softener will usually help or prevent this problem from occurring.  A mild laxative (Milk of Magnesia or Miralax) should be taken according to package directions if there are no bowel movements after 48 hours. Unless discharge instructions indicate otherwise, you may remove your bandages 24-48 hours after surgery, and you may shower at that time.  You may have steri-strips (small skin tapes) in place directly over the incision.  These strips should be left on the skin for 7-10 days.  If your surgeon used skin glue on the incision, you may shower in 24 hours.  The glue will flake off over the next 2-3 weeks.  Any  sutures or staples will be removed at the office during your follow-up visit. ACTIVITIES:  You may resume regular daily activities (gradually increasing) beginning the next day.  Wearing a good support bra or sports bra minimizes pain and swelling.  You may have sexual intercourse when it is comfortable. You may drive when you no longer are taking prescription pain medication, you can comfortably wear a seatbelt, and you can safely maneuver your car and apply brakes. RETURN TO WORK:  ______________________________________________________________________________________ Dennis Bast should see your doctor in the office for a follow-up appointment approximately two weeks after your surgery.  Your doctor's nurse will typically make your follow-up appointment when she calls you with your pathology report.  Expect your pathology report 2-3 business days after your surgery.  You may call to check if you do not hear from [place] after three days. OTHER INSTRUCTIONS: _______________________________________________________________________________________________ _____________________________________________________________________________________________________________________________________ _____________________________________________________________________________________________________________________________________ _____________________________________________________________________________________________________________________________________  WHEN TO CALL YOUR DOCTOR: Fever over 101.0 Nausea and/or vomiting. Extreme swelling or bruising. Continued bleeding from incision. Increased pain, redness, or drainage from the incision.  The clinic staff is available to answer your questions during regular business hours.  Please don't hesitate to call and ask to speak to one of the nurses for clinical concerns.  If you have a medical emergency, go to the nearest emergency room or call 911.  A surgeon from [facility] is always on call at the hospital.  For further questions, please visit [email or web address]  No Tylenol until 1230pm   Post Anesthesia Home Care Instructions  Activity: Get plenty of rest  for the remainder of the day. A responsible individual must stay with you for 24 hours following the procedure.  For the next 24 hours, DO NOT: -Drive a car -Paediatric nurse -Drink alcoholic beverages -Take any medication unless instructed by your physician -Make any legal decisions or sign important papers.  Meals: Start with liquid foods such as gelatin or soup. Progress to regular foods as tolerated. Avoid greasy, spicy, heavy foods. If nausea and/or vomiting occur, drink only clear liquids until the nausea and/or vomiting subsides. Call your physician if vomiting continues.  Special Instructions/Symptoms: Your throat may feel dry or sore from the anesthesia or the breathing tube placed in your throat during surgery. If this causes discomfort, gargle with warm salt water. The discomfort should disappear within 24 hours.  If you had a scopolamine patch placed behind your ear for the management of post- operative nausea and/or vomiting:  1. The medication in the patch is effective for 72 hours, after which it should be removed.  Wrap patch in a tissue and discard in the trash. Wash hands thoroughly with soap and water. 2. You may remove the patch earlier than 72 hours if you experience unpleasant side effects which may include dry mouth, dizziness or visual disturbances. 3. Avoid touching the patch. Wash your hands with soap and water after contact with the patch.

## 2021-05-24 NOTE — Op Note (Signed)
Pre-op Diagnosis:  Left breast complex sclerosing lesion Post-op Diagnosis: same Procedure:  Left radioactive seed localized lumpectomy Surgeon:  Markeda Narvaez K. Resident Assistant:  Dr. Lucas Mallow I was personally present during the key and critical portions of this procedure and immediately available throughout the entire procedure, as documented in my operative note.  Anesthesia:  GEN - LMA Indications:   This is a 45 year old [place] female in good health who presents with a recent screening mammogram that revealed an area of distortion in the left retroareolar region at 3:00.  She underwent biopsy of this area which measured 6 mm in diameter.  Pathology showed a complex sclerosing lesion.  The patient denies any previous breast problems or breast surgery.  She has no family history of breast cancer.  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 Description of procedure: The patient is brought to the operating room placed in supine position on the operating room table. After an adequate level of general anesthesia was obtained, her left breast was prepped with ChloraPrep and draped in sterile fashion. A timeout was taken to ensure the proper patient and proper procedure. We interrogated the breast with the neoprobe. We made a circumareolar incision around the lateral side of the nipple after infiltrating with 0.25% Marcaine. Dissection was carried down in the breast tissue with cautery. We used the neoprobe to guide [place] towards the radioactive seed. We excised an area of tissue around the radioactive seed 1.5 cm in diameter. The specimen was removed and was oriented with a paint kit. Specimen mammogram showed the radioactive seed as well as the biopsy clip within the specimen. This was sent for pathologic examination. There is no residual radioactivity within the biopsy cavity. We inspected carefully for hemostasis. The wound was thoroughly irrigated. The wound was closed with a deep  layer of 3-0 Vicryl and a subcuticular layer of 4-0 Monocryl. Benzoin Steri-Strips were applied. The patient was then extubated and brought to the recovery room in stable condition. All sponge, instrument, and needle counts are correct.  Imogene Burn. [name], MD, [facility]  General/ Trauma Surgery  05/24/2021 8:15 AM

## 2021-05-24 NOTE — H&P (Signed)
History of Present Illness  The patient is a 45 year old female who presents with a breast mass. Referred by [name] NP for left breast mass   This is a 45 year old [place] female in good health who presents with a recent screening mammogram that revealed an area of distortion in the left retroareolar region at 3:00.  She underwent biopsy of this area which measured 6 mm in diameter.  Pathology showed a complex sclerosing lesion.  The patient denies any previous breast problems or breast surgery.  She has no family history of breast cancer.  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     Problem List/Past Medical [name] 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 Claritin  ('10MG'$  Tablet, Oral) Active. Multivitamin  (Oral) Active.   Social History Caffeine use  Coffee, Tea. No alcohol use  No drug use  Tobacco use  Never smoker.   Family History  Cancer  Father. Cerebrovascular Accident  Father. Depression  Father. Hypertension  Father, Mother.   Pregnancy / Birth History  Age at menarche  71 years. Contraceptive History  Intrauterine device. Gravida  2 Irregular periods  Length (months) of breastfeeding  3-6 Maternal age  80-30 Para  2   Other Problems  Back Pain  High blood pressure          Review of Systems  General Not Present- Appetite Loss, Chills, Fatigue, Fever, Night Sweats, Weight Gain and Weight Loss. Skin Not Present- Change in Wart/Mole, Dryness, Hives, Jaundice,  New Lesions, Non-Healing Wounds, Rash and Ulcer. HEENT Present- Seasonal Allergies. Not Present- Earache, Hearing Loss, Hoarseness, Nose Bleed, Oral Ulcers, Ringing in the Ears, Sinus Pain, Sore Throat, Visual Disturbances, Wears glasses/contact lenses and Yellow Eyes. Respiratory Present- Snoring. Not Present- Bloody sputum, Chronic Cough, Difficulty Breathing and Wheezing. Cardiovascular Present- Leg Cramps and Rapid Heart Rate. Not Present- Chest Pain, Difficulty Breathing Lying Down, Palpitations, Shortness of Breath and Swelling of Extremities. Gastrointestinal Not Present- Abdominal Pain, Bloating, Bloody Stool, Change in Bowel Habits, Chronic diarrhea, Constipation, Difficulty Swallowing, Excessive gas, Gets full quickly at meals, Hemorrhoids, Indigestion, Nausea, Rectal Pain and Vomiting. Female Genitourinary Not Present- Frequency, Nocturia, Painful Urination, Pelvic Pain and Urgency. Musculoskeletal Present- Back Pain. Not Present- Joint Pain, Joint Stiffness, Muscle Pain, Muscle Weakness and Swelling of Extremities. Neurological Not Present- Decreased Memory, Fainting, Headaches, Numbness, Seizures, Tingling, Tremor, Trouble walking and Weakness. Psychiatric Not Present- Anxiety, Bipolar, Change in Sleep Pattern, Depression, Fearful and Frequent crying. Endocrine Not Present- Cold Intolerance, Excessive Hunger, Hair Changes, Heat Intolerance, Hot flashes and New Diabetes. Hematology Not Present- Blood Thinners, Easy Bruising, Excessive bleeding, Gland problems, HIV and Persistent Infections.   Vitals  Weight: 194.2 lb   Height: 64 in  Body Surface Area: 1.93 m   Body Mass Index: 33.33 kg/m   Pulse: 92 (Regular)    BP: 124/80(Sitting, Left Arm, Standard)               Physical Exam    The physical exam findings are as follows: Note: Constitutional: WDWN  in NAD, conversant, no obvious deformities; resting comfortably Eyes: Pupils equal, round; sclera anicteric; moist  conjunctiva; no lid lag HENT: Oral mucosa moist; good dentition Neck: No masses palpated, trachea midline; no thyromegaly Lungs: CTA bilaterally; normal respiratory effort Breasts: Symmetric, no palpable masses, no nipple changes or discharge, no axillary lymphadenopathy on either side. 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  The surgical procedure has been discussed with the patient.  Potential risks, benefits, alternative treatments, and expected outcomes have been explained.  All of the patient's questions at this time have been answered.  The likelihood of reaching the patient's treatment goal is good.  The patient understand the proposed surgical procedure and wishes to proceed.  Imogene Burn. [name], MD, [facility] Surgery  General Surgery   05/24/2021 7:13 AM

## 2021-05-24 NOTE — Anesthesia Postprocedure Evaluation (Signed)
Anesthesia Post Note  Patient: Colleen Roach  Procedure(s) Performed: LEFT BREAST LUMPECTOMY WITH RADIOACTIVE SEED LOCALIZATION (Left: Breast)     Patient location during evaluation: PACU Anesthesia Type: General Level of consciousness: awake and alert Pain management: pain level controlled Vital Signs Assessment: post-procedure vital signs reviewed and stable Respiratory status: spontaneous breathing, nonlabored ventilation, respiratory function stable and patient connected to nasal cannula oxygen Cardiovascular status: blood pressure returned to baseline and stable Postop Assessment: no apparent nausea or vomiting Anesthetic complications: no   No notable events documented.  Last Vitals:  Vitals:   05/24/21 0900 05/24/21 0915  BP: 128/82 129/83  Pulse: 79 72  Resp: 17 16  Temp:  36.7 C  SpO2: 99% 96%    Last Pain:  Vitals:   05/24/21 0915  TempSrc:   PainSc: 2                  Aileene Lanum

## 2021-05-25 ENCOUNTER — Encounter (HOSPITAL_BASED_OUTPATIENT_CLINIC_OR_DEPARTMENT_OTHER): Payer: Self-pay | Admitting: Surgery

## 2021-05-25 NOTE — Telephone Encounter (Signed)
Rx was sent last month 90tab w/ 1rf.  Pharmacy should have one on file.

## 2021-05-30 NOTE — Progress Notes (Signed)
New Breast Cancer Diagnosis: Left Breast  Did patient present with symptoms (if so, please note symptoms) or screening mammography?: Screening mammogram revealed an area of distortion in the left retro-areolar region of the let breast.   Location and Extent of disease :left breast. Located at 3 o'clock position, measured  6 mm in diameter.   Histology per Pathology Report: grade High, DCIS 05/24/2021  Receptor Status: ER(), PR (), Her2-neu (), Ki-(%)  Surgeon and surgical plan, if any:  Dr. [name] -Left Breast Lumpectomy with radioactive seed localization 05/24/2021 -Follow-up in 3 weeks  Medical oncologist, treatment if any:   Dr. Lindi Adie 06/01/2021  Family History of Breast/Ovarian/Prostate Cancer: None  Lymphedema issues, if any: None  Pain issues, if any:   Soreness at surgery site.   SAFETY ISSUES: Prior radiation? No Pacemaker/ICD? no Possible current pregnancy? IUD Is the patient on methotrexate? No  Current Complaints / other details:

## 2021-05-31 ENCOUNTER — Ambulatory Visit
Admission: RE | Admit: 2021-05-31 | Discharge: 2021-05-31 | Disposition: A | Discharge status: Home / Self Care | Payer: 59 | Source: Ambulatory Visit | Attending: Radiation Oncology | Admitting: Radiation Oncology

## 2021-05-31 ENCOUNTER — Encounter: Payer: Self-pay | Admitting: Radiation Oncology

## 2021-05-31 VITALS — Ht 64.0 in | Wt 196.0 lb

## 2021-05-31 DIAGNOSIS — Z170 Estrogen receptor positive status [ER+]: Secondary | ICD-10-CM

## 2021-05-31 DIAGNOSIS — C50412 Malignant neoplasm of upper-outer quadrant of left female breast: Secondary | ICD-10-CM

## 2021-05-31 DIAGNOSIS — D0512 Intraductal carcinoma in situ of left breast: Secondary | ICD-10-CM

## 2021-05-31 NOTE — Progress Notes (Signed)
[place] [facility] NOTE  Patient Care Team: [name], NP as PCP - General (Internal Medicine) [name], NP (Internal Medicine)  CHIEF COMPLAINTS/PURPOSE OF CONSULTATION:  Newly diagnosed left breast cancer  HISTORY OF PRESENTING ILLNESS:  Colleen Roach 45 y.o. female is here because of recent diagnosis of DCIS of the left breast. Screening mammogram on 12/11/20 showed possible asymmetry bilaterally. Diagnostic mammogram and [place] on 01/31/21 showed subtle architectural distortion involving the outer left breast and no evidence of malignancy in the right breast. Left lumpectomy on 05/24/21 showed 1 mm high grade DCIS with in situ carcinoma 3 mm from the inferior margin. She presents to the clinic today for initial evaluation and discussion of treatment options.   I reviewed her records extensively and collaborated the history with the patient.  SUMMARY OF ONCOLOGIC HISTORY: Oncology History  Ductal carcinoma in situ (DCIS) of left breast  05/24/2021 Initial Diagnosis   Screening mammogram showed possible asymmetry bilaterally. Diagnostic mammogram and US showed subtle architectural distortion involving the outer left breast. Left lumpectomy on 05/24/21 showed 1 mm high grade DCIS with in situ carcinoma 3 mm from the inferior margin.   06/01/2021 Cancer Staging   Staging form: Breast, AJCC 8th Edition - Clinical: Stage 0 (cTis (DCIS), cN0, cM0, G3, ER: Unknown, PR: Unknown, HER2: Not Assessed) - Signed by Nicholas Lose, MD on 06/01/2021  Stage prefix: Initial diagnosis  Histologic grading system: 3 grade system      MEDICAL HISTORY:  Past Medical History:  Diagnosis Date   Back pain    Fatty liver    Hypertension     SURGICAL HISTORY: Past Surgical History:  Procedure Laterality Date   BREAST LUMPECTOMY WITH RADIOACTIVE SEED LOCALIZATION Left 05/24/2021   Procedure: LEFT BREAST LUMPECTOMY WITH RADIOACTIVE SEED LOCALIZATION;  Surgeon: Donnie Mesa, MD;  Location: [facility];  Service: General;  Laterality: Left;    SOCIAL HISTORY: Social History   Socioeconomic History   Marital status: Married    Spouse name: Not on file   Number of children: 2   Years of education: Master   Highest education level: Not on file  Occupational History   Occupation: Lab tech  Tobacco Use   Smoking status: Never   Smokeless tobacco: Never  Vaping Use   Vaping Use: Never used  Substance and Sexual Activity   Alcohol use: Never   Drug use: Never   Sexual activity: Yes    Partners: Male    Birth control/protection: I.U.D.    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 [place] Moved to the [place] at age 66 with her husband Enjoys movies, cooking Sister and dad live in [place] Determinants of Health   Financial Resource Strain: Not on [employer] Insecurity: Not on file  Transportation Needs: Not on file  Physical Activity: Not on file  Stress: Not on file  Social Connections: Not on file  Intimate Partner Violence: Not At Risk   Fear of Current or Ex-Partner: No   Emotionally Abused: No   Physically Abused: No   Sexually Abused: No    FAMILY HISTORY: Family History  Problem Relation Age of Onset   Hypertension Mother    Hypertension Father  Liver cancer Father     ALLERGIES:  is allergic to augmentin [amoxicillin-pot clavulanate], fish allergy, ibuprofen, pollen extract, almond (diagnostic), cashew nut oil, eggs or egg-derived products, naproxen sodium, peanut-containing drug products, and shellfish allergy.  MEDICATIONS:  Current Outpatient Medications  Medication Sig Dispense Refill   amLODipine (NORVASC) 5 MG tablet Take 5 mg by mouth daily.     loratadine (CLARITIN) 10 MG tablet Take 1 tablet (10 mg total) by  mouth daily. 90 tablet 3   Multiple Vitamins-Calcium (ONE-A-DAY WOMENS FORMULA) TABS One tab by mouth once daily 90 tablet 3   No current facility-administered medications for this visit.    REVIEW OF SYSTEMS:   Constitutional: Denies fevers, chills or abnormal night sweats Eyes: Denies blurriness of vision, double vision or watery eyes Ears, nose, mouth, throat, and face: Denies mucositis or sore throat Respiratory: Denies cough, dyspnea or wheezes Cardiovascular: Denies palpitation, chest discomfort or lower extremity swelling Gastrointestinal:  Denies nausea, heartburn or change in bowel habits Skin: Denies abnormal skin rashes Lymphatics: Denies new lymphadenopathy or easy bruising Neurological:Denies numbness, tingling or new weaknesses Behavioral/Psych: Mood is stable, no new changes  Breast: Recent lumpectomy All other systems were reviewed with the patient and are negative.  PHYSICAL EXAMINATION: ECOG PERFORMANCE STATUS: 0 - Asymptomatic  Vitals:   06/01/21 1551  BP: (!) 145/85  Pulse: 78  Resp: 18  Temp: 98.1 F (36.7 C)  SpO2: 100%   Filed Weights   06/01/21 1551  Weight: 199 lb 3.2 oz (90.4 kg)      LABORATORY DATA:  I have reviewed the data as listed Lab Results  Component Value Date   WBC 8.5 03/19/2019   HGB 14.0 03/19/2019   HCT 42.9 03/19/2019   MCV 86.3 03/19/2019   PLT 274 03/19/2019   Lab Results  Component Value Date   NA 138 04/04/2021   K 4.1 04/04/2021   CL 103 04/04/2021   CO2 24 04/04/2021    RADIOGRAPHIC STUDIES: I have personally reviewed the radiological reports and agreed with the findings in the report.  ASSESSMENT AND PLAN:  Ductal carcinoma in situ (DCIS) of left breast Initial biopsy: Complex sclerosing lesion Left lumpectomy: High-grade DCIS 1 mm, margins negative, ER/PR pending  Pathology review: I discussed with the patient the difference between DCIS and invasive breast cancer. It is considered a precancerous lesion.  DCIS is classified as a 0. It is generally detected through mammograms as calcifications. We discussed the significance of grades and its impact on prognosis. We also discussed the importance of ER and PR receptors and their implications to adjuvant treatment options. Prognosis of DCIS dependence on grade, comedo necrosis. It is anticipated that if not treated, 20-30% of DCIS can develop into invasive breast cancer.  Recommendation: 1.Adjuvant radiation therapy 3. Followed by antiestrogen therapy with tamoxifen 5 years (if there is insufficient tissue for ER/PR testing we will still recommend tamoxifen therapy)  Tamoxifen counseling: We discussed the risks and benefits of tamoxifen. These include but not limited to insomnia, hot flashes, mood changes, vaginal dryness, and weight gain. Although rare, serious side effects including endometrial cancer, risk of blood clots were also discussed. We strongly believe that the benefits far outweigh the risks. Patient understands these risks and consented to starting treatment. Planned treatment duration is 5 years.  Return to clinic at the end of radiation to start antiestrogen therapy  Return to clinic after surgery to discuss the final pathology report and come up with an adjuvant treatment plan.  All questions were answered. The patient knows to call the clinic with any problems, questions or concerns.   Rulon Eisenmenger, MD, MPH 06/01/2021    I, Thana Ates, am acting as scribe for Nicholas Lose, MD.  I have reviewed the above documentation for accuracy and completeness, and I agree with the above.

## 2021-06-01 ENCOUNTER — Inpatient Hospital Stay: Payer: 59 | Attending: Hematology and Oncology | Admitting: Hematology and Oncology

## 2021-06-01 ENCOUNTER — Other Ambulatory Visit: Payer: Self-pay

## 2021-06-01 ENCOUNTER — Encounter: Payer: Self-pay | Admitting: *Deleted

## 2021-06-01 DIAGNOSIS — D0512 Intraductal carcinoma in situ of left breast: Secondary | ICD-10-CM | POA: Insufficient documentation

## 2021-06-01 DIAGNOSIS — Z79899 Other long term (current) drug therapy: Secondary | ICD-10-CM | POA: Insufficient documentation

## 2021-06-01 NOTE — Assessment & Plan Note (Signed)
Initial biopsy: Complex sclerosing lesion Left lumpectomy: High-grade DCIS 1 mm, margins negative, ER/PR pending  Pathology review: I discussed with the patient the difference between DCIS and invasive breast cancer. It is considered a precancerous lesion. DCIS is classified as a 0. It is generally detected through mammograms as calcifications. We discussed the significance of grades and its impact on prognosis. We also discussed the importance of ER and PR receptors and their implications to adjuvant treatment options. Prognosis of DCIS dependence on grade, comedo necrosis. It is anticipated that if not treated, 20-30% of DCIS can develop into invasive breast cancer.  Recommendation: 1.Adjuvant radiation therapy 3. Followed by antiestrogen therapy with tamoxifen 5 years (if there is insufficient tissue for ER/PR testing we will still recommend tamoxifen therapy)  Tamoxifen counseling: We discussed the risks and benefits of tamoxifen. These include but not limited to insomnia, hot flashes, mood changes, vaginal dryness, and weight gain. Although rare, serious side effects including endometrial cancer, risk of blood clots were also discussed. We strongly believe that the benefits far outweigh the risks. Patient understands these risks and consented to starting treatment. Planned treatment duration is 5 years.  Return to clinic at the end of radiation to start antiestrogen therapy  Return to clinic after surgery to discuss the final pathology report and come up with an adjuvant treatment plan.

## 2021-06-02 ENCOUNTER — Telehealth: Payer: Self-pay | Admitting: Hematology and Oncology

## 2021-06-02 LAB — SURGICAL PATHOLOGY

## 2021-06-02 NOTE — Telephone Encounter (Signed)
Scheduled appointment per 08/03 los. Patient is aware.

## 2021-06-02 NOTE — Progress Notes (Signed)
Radiation Oncology         [phone] ________________________________  Name: Colleen Roach MRN: [id number]  Date: 05/31/2021  DOB: 1975/12/30  CC:O'Sullivan, Lenna Sciara, NP  Donnie Mesa, MD     REFERRING PHYSICIAN: Donnie Mesa, MD   DIAGNOSIS: The primary encounter diagnosis was Malignant neoplasm of upper-outer quadrant of left breast in female, estrogen receptor positive ([place]). A diagnosis of Ductal carcinoma in situ (DCIS) of left breast was also pertinent to this visit.   HISTORY OF PRESENT ILLNESS::Colleen Roach is a 45 y.o. female who is seen for an initial consultation visit regarding the patient's diagnosis of DCIS of the left breast.  The patient on screening mammogram was found to have suspicious findings within the left breast in the retroareolar region at the 3 o'clock position.  The abnormality measured 0.6 cm.  Biopsy returned positive for a CSL and the patient was scheduled for a left-sided lumpectomy on 05/24/2021.  Final pathology from the specimen revealed a focus of grade 3 ductal carcinoma in situ.  Margins were negative with the closest margin being greater than 2 mm.  Given this finding, I have been asked to see the patient for consideration of adjuvant radiation treatment.    PREVIOUS RADIATION THERAPY: No   PAST MEDICAL HISTORY:  has a past medical history of Back pain, Fatty liver, and Hypertension.     PAST SURGICAL HISTORY: Past Surgical History:  Procedure Laterality Date   BREAST LUMPECTOMY WITH RADIOACTIVE SEED LOCALIZATION Left 05/24/2021   Procedure: LEFT BREAST LUMPECTOMY WITH RADIOACTIVE SEED LOCALIZATION;  Surgeon: Donnie Mesa, MD;  Location: [place];  Service: General;  Laterality: Left;     FAMILY HISTORY: family history includes Hypertension in her father and mother; Liver cancer in her father.   SOCIAL HISTORY:  reports that she has never smoked. She has never used smokeless tobacco. She reports that she does not  drink alcohol and does not use drugs.   ALLERGIES: Augmentin [amoxicillin-pot clavulanate], Fish allergy, Ibuprofen, Pollen extract, Almond (diagnostic), Cashew nut oil, Eggs or egg-derived products, Naproxen sodium, Peanut-containing drug products, and Shellfish allergy   MEDICATIONS:  Current Outpatient Medications  Medication Sig Dispense Refill   amLODipine (NORVASC) 5 MG tablet Take 5 mg by mouth daily.     loratadine (CLARITIN) 10 MG tablet Take 1 tablet (10 mg total) by mouth daily. 90 tablet 3   Multiple Vitamins-Calcium (ONE-A-DAY WOMENS FORMULA) TABS One tab by mouth once daily 90 tablet 3   No current facility-administered medications for this encounter.     REVIEW OF SYSTEMS:  A 15 point review of systems is documented in the electronic medical record. This was obtained by the nursing staff. However, I reviewed this with the patient to discuss relevant findings and make appropriate changes.  Pertinent items are noted in HPI.    PHYSICAL EXAM:  height is '5\' 4"'  (1.626 m) and weight is 196 lb (88.9 kg).   ECOG = 0  0 - Asymptomatic (Fully active, able to carry on all predisease activities without restriction)  1 - Symptomatic but completely ambulatory (Restricted in physically strenuous activity but ambulatory and able to carry out work of a light or sedentary nature. For example, light housework, office work)  2 - Symptomatic, <50% in bed during the day (Ambulatory and capable of all self care but unable to carry out any work activities. Up and about more than 50% of waking hours)  3 - Symptomatic, >50% in bed, but not  bedbound (Capable of only limited self-care, confined to bed or chair 50% or more of waking hours)  4 - Bedbound (Completely disabled. Cannot carry on any self-care. Totally confined to bed or chair)  5 - Death   Eustace Pen MM, Creech RH, Tormey DC, et al. [phone]). "Toxicity and response criteria of the [facility]". [place] Oncol. 5  (6): 649-55  Alert, no distress   LABORATORY DATA:  Lab Results  Component Value Date   WBC 8.5 03/19/2019   HGB 14.0 03/19/2019   HCT 42.9 03/19/2019   MCV 86.3 03/19/2019   PLT 274 03/19/2019   Lab Results  Component Value Date   NA 138 04/04/2021   K 4.1 04/04/2021   CL 103 04/04/2021   CO2 24 04/04/2021   Lab Results  Component Value Date   ALT 40 (H) 04/04/2021   AST 18 04/04/2021   BILITOT 0.6 04/04/2021      RADIOGRAPHY: MM Breast Surgical Specimen  Result Date: 05/24/2021 CLINICAL DATA:  45 year old with a biopsy-proven high risk complex sclerosing lesion in the LEFT breast. Radioactive seed localization was performed yesterday in anticipation of today's excisional biopsy. EXAM: SPECIMEN RADIOGRAPH OF THE LEFT BREAST COMPARISON:  Previous exam(s). FINDINGS: Status post excision of the LEFT breast. The radioactive seed and the coil shaped tissue marker clip are present in the non-compressed specimen. The radioactive seed is intact. These results were discussed directly with the operating room nurse at the time of interpretation on 05/24/2021 at 8:06 a.m. IMPRESSION: Specimen radiograph of the LEFT breast. Electronically Signed   By: Evangeline Dakin M.D.   On: 05/24/2021 08:09  MM LT RADIOACTIVE SEED LOC MAMMO GUIDE  Result Date: 05/23/2021 CLINICAL DATA:  Localization prior to surgery EXAM: MAMMOGRAPHIC GUIDED RADIOACTIVE SEED LOCALIZATION OF THE LEFT BREAST COMPARISON:  Previous exam(s). FINDINGS: Patient presents for radioactive seed localization prior to surgery. I met with the patient and we discussed the procedure of seed localization including benefits and alternatives. We discussed the high likelihood of a successful procedure. We discussed the risks of the procedure including infection, bleeding, tissue injury and further surgery. We discussed the low dose of radioactivity involved in the procedure. Informed, written consent was given. The usual time-out protocol  was performed immediately prior to the procedure. Using mammographic guidance, sterile technique, 1% lidocaine and an I-125 radioactive seed, the previously placed biopsy clip was localized using a lateral approach. The follow-up mammogram images confirm the seed in the expected location and were marked for the surgeon. Follow-up survey of the patient confirms presence of the radioactive seed. Order number of I-125 seed:  [id number]. Total activity:  7.846 millicuries reference Date: May 11, 2021 The patient tolerated the procedure well and was released from the [facility]. She was given instructions regarding seed removal. IMPRESSION: Radioactive seed localization left breast. No apparent complications. Electronically Signed   By: Dorise Bullion III M.D   On: 05/23/2021 15:44      IMPRESSION/ PLAN:  The patient is status post left-sided lumpectomy for what returned positive for a grade 3 ductal carcinoma in situ of the left breast.  Negative margins were achieved.  The patient is appropriate to proceed with adjuvant radiation treatment given this diagnosis.  I discussed the rationale of such a treatment as well as possible side effects and risks.  We also discussed the expected benefit in terms of local control.  All of her questions were answered and she indicates that she does wish to  proceed with this treatment at the appropriate time.  She also sees medical oncology later this week.  We will proceed with simulation within the next several weeks for treatment planning.  Given current concerns for patient exposure during the COVID-19 pandemic, this encounter was conducted via telephone.  The patient has given verbal consent for this type of encounter. The time spent during this encounter was 45 minutes and more than 50% of that time was spent in the coordination of the patient's care. The attendants for this meeting included Dr. Lisbeth Renshaw, and the patient  During the encounter Dr. Lisbeth Renshaw was located at  [facility] Radiation Oncology Department.  The patient  was located at home.        ________________________________   Jodelle Gross, MD, PhD   **Disclaimer: This note was dictated with voice recognition software. Similar sounding words can inadvertently be transcribed and this note may contain transcription errors which may not have been corrected upon publication of note.**

## 2021-06-03 ENCOUNTER — Telehealth: Payer: Self-pay | Admitting: *Deleted

## 2021-06-03 ENCOUNTER — Encounter: Payer: Self-pay | Admitting: *Deleted

## 2021-06-03 NOTE — Telephone Encounter (Signed)
Left message for a return phone call to give her prognostic results.

## 2021-06-17 ENCOUNTER — Telehealth: Payer: Self-pay | Admitting: Genetic Counselor

## 2021-06-17 NOTE — Telephone Encounter (Signed)
Received call from Ms. Colleen Roach asking about costs of genetics appointments.  Provided CPT code to check with coverage through insurance.  Discussed billing policies of genetic testing laboratories.

## 2021-06-20 ENCOUNTER — Other Ambulatory Visit: Payer: Self-pay

## 2021-06-20 ENCOUNTER — Other Ambulatory Visit: Payer: Self-pay | Admitting: Genetic Counselor

## 2021-06-20 ENCOUNTER — Inpatient Hospital Stay (HOSPITAL_BASED_OUTPATIENT_CLINIC_OR_DEPARTMENT_OTHER): Payer: 59 | Admitting: Genetic Counselor

## 2021-06-20 ENCOUNTER — Inpatient Hospital Stay: Payer: 59

## 2021-06-20 DIAGNOSIS — D0512 Intraductal carcinoma in situ of left breast: Secondary | ICD-10-CM

## 2021-06-20 LAB — GENETIC SCREENING ORDER

## 2021-06-21 ENCOUNTER — Ambulatory Visit
Admission: RE | Admit: 2021-06-21 | Discharge: 2021-06-21 | Disposition: A | Discharge status: Home / Self Care | Payer: 59 | Source: Ambulatory Visit | Attending: Radiation Oncology | Admitting: Radiation Oncology

## 2021-06-21 DIAGNOSIS — Z170 Estrogen receptor positive status [ER+]: Secondary | ICD-10-CM | POA: Insufficient documentation

## 2021-06-21 DIAGNOSIS — C50412 Malignant neoplasm of upper-outer quadrant of left female breast: Secondary | ICD-10-CM | POA: Diagnosis not present

## 2021-06-21 DIAGNOSIS — Z510 Encounter for antineoplastic radiation therapy: Secondary | ICD-10-CM | POA: Insufficient documentation

## 2021-06-21 NOTE — Progress Notes (Signed)
REFERRING PROVIDER: Nicholas Lose, MD [place],  [place] [address]  PRIMARY PROVIDER:  [name], NP  PRIMARY REASON FOR VISIT:  1. Ductal carcinoma in situ (DCIS) of left breast     HISTORY OF PRESENT ILLNESS:   Colleen Roach, a 45 y.o. female, was seen for a [place] cancer genetics consultation at the request of Dr. Lindi Adie due to a personal history of breast cancer.  [name] presents to clinic today to discuss the possibility of a hereditary predisposition to cancer, to discuss genetic testing, and to further clarify her future cancer risks, as well as potential cancer risks for family members.   In April 2022, left breast biopsy revealed complex sclerosing lesion with usual ductal hyperplasia. She is status post lumpectomy which showed ductal carcinoma in situ (DCIS) of the left breast (ER-/PR-) in July 2022. The treatment plan includes adjuvant radiation therapy.   CANCER HISTORY:  Oncology History  Ductal carcinoma in situ (DCIS) of left breast  05/24/2021 Initial Diagnosis   Screening mammogram showed possible asymmetry bilaterally. Diagnostic mammogram and US showed subtle architectural distortion involving the outer left breast. Left lumpectomy on 05/24/21 showed 1 mm high grade DCIS with in situ carcinoma 3 mm from the inferior margin.   06/01/2021 Cancer Staging   Staging form: Breast, AJCC 8th Edition - Clinical: Stage 0 (cTis (DCIS), cN0, cM0, G3, ER: Unknown, PR: Unknown, HER2: Not Assessed) - Signed by Nicholas Lose, MD on 06/01/2021 Stage prefix: Initial diagnosis Histologic grading system: 3 grade system      RISK FACTORS:  Menarche was at age 69.  First live birth at age 75.  OCP use for approximately 2 years.  Ovaries intact: yes.  Hysterectomy: no.  Menopausal status: premenopausal.  HRT use: 0 years. Colonoscopy: no; not examined. Mammogram within the last year: yes. Up to date with pelvic exams: yes. Any excessive radiation  exposure in the past: no  Past Medical History:  Diagnosis Date   Back pain    Fatty liver    Hypertension     Past Surgical History:  Procedure Laterality Date   BREAST LUMPECTOMY WITH RADIOACTIVE SEED LOCALIZATION Left 05/24/2021   Procedure: LEFT BREAST LUMPECTOMY WITH RADIOACTIVE SEED LOCALIZATION;  Surgeon: Donnie Mesa, MD;  Location: [facility] [facility];  Service: General;  Laterality: Left;    Social History   Socioeconomic History   Marital status: Married    Spouse name: Not on file   Number of children: 2   Years of education: Master   Highest education level: Not on file  Occupational History   Occupation: Lab tech  Tobacco Use   Smoking status: Never   Smokeless tobacco: Never  Vaping Use   Vaping Use: Never used  Substance and Sexual Activity   Alcohol use: Never   Drug use: Never   Sexual activity: Yes    Partners: Male    Birth control/protection: I.U.D.    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  Significant diagnoses are listed below: Family History  Problem Relation Age of Onset   Hypertension Mother    Hypertension Father    Liver cancer Father 81    Ms. Lengel is unaware of previous family history of genetic testing for hereditary cancer risks. Patient's maternal ancestors are of [place] descent, and paternal ancestors are of [place] descent. There  is no reported Ashkenazi Jewish ancestry. There is no known consanguinity.  GENETIC COUNSELING ASSESSMENT: [name] is a 45 y.o. female with a personal history of cancer which is somewhat suggestive of a hereditary cancer syndrome and predisposition to cancer given her age of diagnosis. We, therefore, discussed and recommended the following at today's visit.   DISCUSSION: We discussed that 5 - 10% of cancer is hereditary, with most cases of hereditary breast cancer associated with mutations in BRCA1/2.  There are other genes that can be associated with hereditary breast cancer syndromes.  These include but are not limited to CHEK2, PALB2, and ATM.  We discussed that testing is beneficial for several reasons including knowing how to follow individuals after completing their treatment, identifying whether potential treatment options would be beneficial, and understanding if other family members could be at risk for cancer and allowing them to undergo genetic testing.   We reviewed the characteristics, features and inheritance patterns of hereditary cancer syndromes. We also discussed genetic testing, including the appropriate family members to test, the process of testing, insurance coverage and turn-around-time for results. We discussed the implications of a negative, positive, carrier and/or variant of uncertain significant result. We recommended [name] pursue genetic testing for a panel that includes genes associated with breast cancer.   [name]  was offered a common hereditary cancer panel (47 genes) and an expanded pan-cancer panel (77 genes). [name] was informed of the benefits and limitations of each panel, including that expanded pan-cancer panels contain genes that do not have clear management guidelines at this point in time.  We also discussed that as the number of genes included on a panel increases, the chances of variants of uncertain significance increases.  After considering the benefits and  limitations of each gene panel, [name]  elected to have an expanded pan-cancer panel through [place].  The CancerNext-Expanded gene panel offered by [facility] and includes sequencing, rearrangement, and RNA analysis for the following 77 genes: AIP, ALK, APC, ATM, AXIN2, BAP1, BARD1, BLM, BMPR1A, BRCA1, BRCA2, BRIP1, CDC73, CDH1, CDK4, CDKN1B, CDKN2A, CHEK2, CTNNA1, DICER1, FANCC, FH, FLCN, GALNT12, KIF1B, LZTR1, MAX, MEN1, MET, MLH1, MSH2, MSH3, MSH6, MUTYH, NBN, NF1, NF2, NTHL1, PALB2, PHOX2B, PMS2, POT1, PRKAR1A, PTCH1, PTEN, RAD51C, RAD51D, RB1, RECQL, RET, SDHA, SDHAF2, SDHB, SDHC, SDHD, SMAD4, SMARCA4, SMARCB1, SMARCE1, STK11, SUFU, TMEM127, TP53, TSC1, TSC2, VHL and XRCC2 (sequencing and deletion/duplication); EGFR, EGLN1, HOXB13, KIT, MITF, PDGFRA, POLD1, and POLE (sequencing only); EPCAM and GREM1 (deletion/duplication only).   Based on Ms. Dhami personal history of cancer, she meets medical criteria for genetic testing. Despite that she meets criteria, she may still have an out of pocket cost. We discussed that if her out of pocket cost for testing is over $100, the laboratory should contact her to discuss self-pay options and/or patient pay assistance programs.   PLAN: After considering the risks, benefits, and limitations, Ms. Colantonio provided informed consent  to pursue genetic testing and the blood sample was sent to [facility] for analysis of the CancerNext-Expanded +RNAinsight Panel. Results should be available within approximately 2-3 weeks' time, at which point they will be disclosed by telephone to Ms. Sachs, as will any additional recommendations warranted by these results. Ms. Crutchley will receive a summary of her genetic counseling visit and a copy of her results once available. This information will also be available in Epic.   Lastly, we encouraged Ms. Fishbaugh to remain in contact with cancer genetics annually so that we can continuously update the family history and inform her of any  changes in cancer genetics and testing that may be of benefit for this family.   Ms. Zobrist questions were answered to her satisfaction today. Our contact information was provided should additional questions or concerns arise. Thank you for the referral and allowing [place] to share in the care of your patient.   Verna Hamon M. Joette Catching, [place], [facility] Genetic Counselor [email or web address] (P) [phone]  The patient was seen for a total of 30 minutes in face-to-face genetic counseling.  The patient was seen alone.  Drs. Magrinat, Lindi Adie and/or Burr Medico were available to discuss this case as needed.    _______________________________________________________________________ For Office Staff:  Number of people involved in session: 1 Was an Intern/ student involved with case: yes; [employer] [facility] intern, Raymond Gurney, was involved in eliciting personal/family history, discussing possible results, and documenting session

## 2021-06-21 NOTE — Progress Notes (Signed)
I saw the patient today at simulation and consented her.  She is 32 with an in situ IUD, she states that she does not have regular menstrual periods.  She had a negative pregnancy test on 05/24/2021 prior to her lumpectomy.  She states she has been sexually active since.  We discussed the risks to a developing fetus if she were to be pregnant and receive radiation therapy and the risks even with radiation from [place].  She states that she is aware of these risks, is not desiring pregnancy at this time and is comfortable moving forward understanding that her IUD does not give her 100% protection of avoiding pregnancy.  We discussed the importance of moving forward with barrier method of contraception during treatment as well such as a condom.  She is in agreement.  Dr. Lisbeth Renshaw is aware of this and is in agreement with moving forward with simulation today.    Carola Rhine, PAC

## 2021-06-24 ENCOUNTER — Encounter: Payer: Self-pay | Admitting: *Deleted

## 2021-06-26 ENCOUNTER — Other Ambulatory Visit: Payer: Self-pay | Admitting: Family

## 2021-06-27 DIAGNOSIS — Z510 Encounter for antineoplastic radiation therapy: Secondary | ICD-10-CM | POA: Diagnosis not present

## 2021-06-28 ENCOUNTER — Ambulatory Visit
Admission: RE | Admit: 2021-06-28 | Discharge: 2021-06-28 | Disposition: A | Discharge status: Home / Self Care | Payer: 59 | Source: Ambulatory Visit | Attending: Radiation Oncology | Admitting: Radiation Oncology

## 2021-06-28 ENCOUNTER — Other Ambulatory Visit: Payer: Self-pay

## 2021-06-28 DIAGNOSIS — Z510 Encounter for antineoplastic radiation therapy: Secondary | ICD-10-CM | POA: Diagnosis not present

## 2021-06-29 ENCOUNTER — Ambulatory Visit: Payer: 59

## 2021-06-29 ENCOUNTER — Ambulatory Visit
Admission: RE | Admit: 2021-06-29 | Discharge: 2021-06-29 | Disposition: A | Discharge status: Home / Self Care | Payer: 59 | Source: Ambulatory Visit | Attending: Radiation Oncology | Admitting: Radiation Oncology

## 2021-06-29 DIAGNOSIS — Z510 Encounter for antineoplastic radiation therapy: Secondary | ICD-10-CM | POA: Diagnosis not present

## 2021-06-30 ENCOUNTER — Ambulatory Visit: Payer: 59

## 2021-06-30 ENCOUNTER — Ambulatory Visit
Admission: RE | Admit: 2021-06-30 | Discharge: 2021-06-30 | Disposition: A | Discharge status: Home / Self Care | Payer: 59 | Source: Ambulatory Visit | Attending: Radiation Oncology | Admitting: Radiation Oncology

## 2021-06-30 ENCOUNTER — Other Ambulatory Visit: Payer: Self-pay

## 2021-06-30 DIAGNOSIS — D0512 Intraductal carcinoma in situ of left breast: Secondary | ICD-10-CM | POA: Diagnosis not present

## 2021-07-01 ENCOUNTER — Ambulatory Visit
Admission: RE | Admit: 2021-07-01 | Discharge: 2021-07-01 | Disposition: A | Discharge status: Home / Self Care | Payer: 59 | Source: Ambulatory Visit | Attending: Radiation Oncology | Admitting: Radiation Oncology

## 2021-07-01 ENCOUNTER — Ambulatory Visit: Payer: 59

## 2021-07-01 DIAGNOSIS — D0512 Intraductal carcinoma in situ of left breast: Secondary | ICD-10-CM | POA: Diagnosis not present

## 2021-07-01 MED ORDER — ALRA NON-METALLIC DEODORANT (RAD-ONC)
1.0000 "application " | Freq: Once | TOPICAL | Status: AC
  Administered 2021-07-01: 1 via TOPICAL

## 2021-07-01 NOTE — Progress Notes (Signed)
Pt here for patient teaching.  Pt given Radiation and You booklet, skin care instructions, and Alra deodorant.  Reviewed areas of pertinence such as fatigue, hair loss, skin changes, breast tenderness, and breast swelling . Pt able to give teach back of to pat skin and use unscented/gentle soap,avoid applying anything to skin within 4 hours of treatment, avoid wearing an under wire bra, and to use an electric razor if they must shave. Pt verbalizes understanding of information given and will contact nursing with any questions or concerns.  Patient will use pure aloe for her skin due to allergy to radiaplex and sonafine creams.   Http://rtanswers.org/treatmentinformation/whattoexpect/index  Gloriajean Dell. Leonie Green, BSN

## 2021-07-05 ENCOUNTER — Ambulatory Visit
Admission: RE | Admit: 2021-07-05 | Discharge: 2021-07-05 | Disposition: A | Discharge status: Home / Self Care | Payer: 59 | Source: Ambulatory Visit | Attending: Radiation Oncology | Admitting: Radiation Oncology

## 2021-07-05 ENCOUNTER — Encounter: Payer: Self-pay | Admitting: Genetic Counselor

## 2021-07-05 ENCOUNTER — Ambulatory Visit: Payer: Self-pay | Admitting: Genetic Counselor

## 2021-07-05 ENCOUNTER — Ambulatory Visit: Payer: 59

## 2021-07-05 ENCOUNTER — Other Ambulatory Visit: Payer: Self-pay

## 2021-07-05 ENCOUNTER — Telehealth: Payer: Self-pay | Admitting: Genetic Counselor

## 2021-07-05 DIAGNOSIS — Z1379 Encounter for other screening for genetic and chromosomal anomalies: Secondary | ICD-10-CM

## 2021-07-05 DIAGNOSIS — D0512 Intraductal carcinoma in situ of left breast: Secondary | ICD-10-CM

## 2021-07-05 NOTE — Progress Notes (Signed)
HPI:  Ms. Gherardi was previously seen in the [facility] due to a personal history of cancer and concerns regarding a hereditary predisposition to cancer. Please refer to our prior cancer genetics clinic note for more information regarding our discussion, assessment and recommendations, at the time. Ms. Lapaglia recent genetic test results were disclosed to her, as were recommendations warranted by these results. These results and recommendations are discussed in more detail below.  CANCER HISTORY:  Oncology History  Ductal carcinoma in situ (DCIS) of left breast  05/24/2021 Initial Diagnosis   Screening mammogram showed possible asymmetry bilaterally. Diagnostic mammogram and US showed subtle architectural distortion involving the outer left breast. Left lumpectomy on 05/24/21 showed 1 mm high grade DCIS with in situ carcinoma 3 mm from the inferior margin.   06/01/2021 Cancer Staging   Staging form: Breast, AJCC 8th Edition - Clinical: Stage 0 (cTis (DCIS), cN0, cM0, G3, ER: Unknown, PR: Unknown, HER2: Not Assessed) - Signed by Nicholas Lose, MD on 06/01/2021 Stage prefix: Initial diagnosis Histologic grading system: 3 grade system   07/01/2021 Genetic Testing   Negative hereditary cancer genetic testing: no pathogenic variants detected in [facility] CancerNext-Expanded +RNAinsight Panel.  The report date is July 01, 2021.   The CancerNext-Expanded gene panel offered by [facility] and includes sequencing, rearrangement, and RNA analysis for the following 77 genes: AIP, ALK, APC, ATM, AXIN2, BAP1, BARD1, BLM, BMPR1A, BRCA1, BRCA2, BRIP1, CDC73, CDH1, CDK4, CDKN1B, CDKN2A, CHEK2, CTNNA1, DICER1, FANCC, FH, FLCN, GALNT12, KIF1B, LZTR1, MAX, MEN1, MET, MLH1, MSH2, MSH3, MSH6, MUTYH, NBN, NF1, NF2, NTHL1, PALB2, PHOX2B, PMS2, POT1, PRKAR1A, PTCH1, PTEN, RAD51C, RAD51D, RB1, RECQL, RET, SDHA, SDHAF2, SDHB, SDHC, SDHD, SMAD4, SMARCA4, SMARCB1, SMARCE1, STK11, SUFU, TMEM127, TP53, TSC1, TSC2,  VHL and XRCC2 (sequencing and deletion/duplication); EGFR, EGLN1, HOXB13, KIT, MITF, PDGFRA, POLD1, and POLE (sequencing only); EPCAM and GREM1 (deletion/duplication only).      FAMILY HISTORY:  We obtained a detailed, 4-generation family history.  Significant diagnoses are listed below: Family History  Problem Relation Age of Onset   Liver cancer Father     Ms. Finerty is unaware of previous family history of genetic testing for hereditary cancer risks. Patient's maternal ancestors are of [place] descent, and paternal ancestors are of [place] descent. There is no reported Ashkenazi Jewish ancestry. There is no known consanguinity.  GENETIC TEST RESULTS: Genetic testing reported out on July 01, 2021. The CancerNext-Expanded +RNAinsight Panel found no pathogenic mutations. The CancerNext-Expanded gene panel offered by [employer] [facility] and includes sequencing, rearrangement, and RNA analysis for the following 77 genes: AIP, ALK, APC, ATM, AXIN2, BAP1, BARD1, BLM, BMPR1A, BRCA1, BRCA2, BRIP1, CDC73, CDH1, CDK4, CDKN1B, CDKN2A, CHEK2, CTNNA1, DICER1, FANCC, FH, FLCN, GALNT12, KIF1B, LZTR1, MAX, MEN1, MET, MLH1, MSH2, MSH3, MSH6, MUTYH, NBN, NF1, NF2, NTHL1, PALB2, PHOX2B, PMS2, POT1, PRKAR1A, PTCH1, PTEN, RAD51C, RAD51D, RB1, RECQL, RET, SDHA, SDHAF2, SDHB, SDHC, SDHD, SMAD4, SMARCA4, SMARCB1, SMARCE1, STK11, SUFU, TMEM127, TP53, TSC1, TSC2, VHL and XRCC2 (sequencing and deletion/duplication); EGFR, EGLN1, HOXB13, KIT, MITF, PDGFRA, POLD1, and POLE (sequencing only); EPCAM and GREM1 (deletion/duplication only).   The test report has been scanned into EPIC and is located under the Molecular Pathology section of the Results Review tab.  A portion of the result report is included below for reference.     We discussed with Ms. Weitzman that because current genetic testing is not perfect, it is possible there may be a gene mutation in one of these genes that current testing cannot detect,  but that chance is  small.  We also discussed, that there could be another gene that has not yet been discovered, or that we have not yet tested, that is responsible for the cancer diagnoses in the family. It is also possible there is a hereditary cause for the cancer in the family that Ms. Putz did not inherit and therefore was not identified in her testing.  Therefore, it is important to remain in touch with cancer genetics in the future so that we can continue to offer Ms. Beecham the most up to date genetic testing.  ADDITIONAL GENETIC TESTING: We discussed with Ms. Leflore that her genetic testing was fairly extensive.  If there are genes identified to increase cancer risk that can be analyzed in the future, we would be happy to discuss and coordinate this testing at that time.    CANCER SCREENING RECOMMENDATIONS: Ms. Render test result is considered negative (normal).  This means that we have not identified a hereditary cause for her personal history of cancer at this time. Most cancers happen by chance and this negative test suggests that her cancer may fall into this category.    While reassuring, this does not definitively rule out a hereditary predisposition to cancer. It is still possible that there could be genetic mutations that are undetectable by current technology. There could be genetic mutations in genes that have not been tested or identified to increase cancer risk.  Therefore, it is recommended she continue to follow the cancer management and screening guidelines provided by her oncology and primary healthcare provider.   An individual's cancer risk and medical management are not determined by genetic test results alone. Overall cancer risk assessment incorporates additional factors, including personal medical history, family history, and any available genetic information that may result in a personalized plan for cancer prevention and surveillance  RECOMMENDATIONS FOR FAMILY MEMBERS:  Individuals in this family  might be at some increased risk of developing cancer, over the general population risk, simply due to the family history of cancer.  We recommended women in this family have a yearly mammogram beginning at age 66, or 61 years younger than the earliest onset of cancer, an annual clinical breast exam, and perform monthly breast self-exams. Women in this family should also have a gynecological exam as recommended by their primary provider. Family members should be referred for colonoscopy starting at age 3, or earlier, as recommended by their providers.    FOLLOW-UP: Lastly, we discussed with Ms. Depaula that cancer genetics is a rapidly advancing field and it is possible that new genetic tests will be appropriate for her and/or her family members in the future. We encouraged her to remain in contact with cancer genetics on an annual basis so we can update her personal and family histories and let her know of advances in cancer genetics that may benefit this family.   Our contact number was provided. Ms. Nass questions were answered to her satisfaction, and she knows she is welcome to call us at anytime with additional questions or concerns.     Damek Ende M. Joette Catching, [place], [facility] Genetic Counselor [email or web address] [phone]) [phone]

## 2021-07-05 NOTE — Telephone Encounter (Signed)
Revealed negative genetic testing.  Discussed that we do not know why she has breast cancer.  It could be sporadic/familial, due to a different gene that we are not testing, or maybe our current technology may not be able to pick something up.  It will be important for her to keep in contact with genetics to keep up with whether additional testing may be needed.

## 2021-07-06 ENCOUNTER — Ambulatory Visit: Payer: 59

## 2021-07-06 ENCOUNTER — Ambulatory Visit
Admission: RE | Admit: 2021-07-06 | Discharge: 2021-07-06 | Disposition: A | Discharge status: Home / Self Care | Payer: 59 | Source: Ambulatory Visit | Attending: Radiation Oncology | Admitting: Radiation Oncology

## 2021-07-06 DIAGNOSIS — D0512 Intraductal carcinoma in situ of left breast: Secondary | ICD-10-CM | POA: Diagnosis not present

## 2021-07-07 ENCOUNTER — Ambulatory Visit
Admission: RE | Admit: 2021-07-07 | Discharge: 2021-07-07 | Disposition: A | Discharge status: Home / Self Care | Payer: 59 | Source: Ambulatory Visit | Attending: Radiation Oncology | Admitting: Radiation Oncology

## 2021-07-07 ENCOUNTER — Other Ambulatory Visit: Payer: Self-pay

## 2021-07-07 ENCOUNTER — Ambulatory Visit: Payer: 59

## 2021-07-07 DIAGNOSIS — D0512 Intraductal carcinoma in situ of left breast: Secondary | ICD-10-CM | POA: Diagnosis not present

## 2021-07-08 ENCOUNTER — Ambulatory Visit
Admission: RE | Admit: 2021-07-08 | Discharge: 2021-07-08 | Disposition: A | Discharge status: Home / Self Care | Payer: 59 | Source: Ambulatory Visit | Attending: Radiation Oncology | Admitting: Radiation Oncology

## 2021-07-08 ENCOUNTER — Ambulatory Visit: Payer: 59

## 2021-07-08 DIAGNOSIS — D0512 Intraductal carcinoma in situ of left breast: Secondary | ICD-10-CM | POA: Diagnosis not present

## 2021-07-11 ENCOUNTER — Ambulatory Visit: Payer: 59

## 2021-07-11 ENCOUNTER — Ambulatory Visit
Admission: RE | Admit: 2021-07-11 | Discharge: 2021-07-11 | Disposition: A | Discharge status: Home / Self Care | Payer: 59 | Source: Ambulatory Visit | Attending: Radiation Oncology | Admitting: Radiation Oncology

## 2021-07-11 ENCOUNTER — Other Ambulatory Visit: Payer: Self-pay

## 2021-07-11 DIAGNOSIS — D0512 Intraductal carcinoma in situ of left breast: Secondary | ICD-10-CM | POA: Diagnosis not present

## 2021-07-12 ENCOUNTER — Ambulatory Visit: Payer: 59

## 2021-07-12 ENCOUNTER — Ambulatory Visit
Admission: RE | Admit: 2021-07-12 | Discharge: 2021-07-12 | Disposition: A | Discharge status: Home / Self Care | Payer: 59 | Source: Ambulatory Visit | Attending: Radiation Oncology | Admitting: Radiation Oncology

## 2021-07-12 DIAGNOSIS — D0512 Intraductal carcinoma in situ of left breast: Secondary | ICD-10-CM | POA: Diagnosis not present

## 2021-07-13 ENCOUNTER — Ambulatory Visit: Payer: 59

## 2021-07-13 ENCOUNTER — Other Ambulatory Visit: Payer: Self-pay

## 2021-07-13 ENCOUNTER — Ambulatory Visit
Admission: RE | Admit: 2021-07-13 | Discharge: 2021-07-13 | Disposition: A | Discharge status: Home / Self Care | Payer: 59 | Source: Ambulatory Visit | Attending: Radiation Oncology | Admitting: Radiation Oncology

## 2021-07-13 DIAGNOSIS — D0512 Intraductal carcinoma in situ of left breast: Secondary | ICD-10-CM | POA: Diagnosis not present

## 2021-07-14 ENCOUNTER — Ambulatory Visit: Payer: 59

## 2021-07-14 ENCOUNTER — Ambulatory Visit
Admission: RE | Admit: 2021-07-14 | Discharge: 2021-07-14 | Disposition: A | Discharge status: Home / Self Care | Payer: 59 | Source: Ambulatory Visit | Attending: Radiation Oncology | Admitting: Radiation Oncology

## 2021-07-14 DIAGNOSIS — D0512 Intraductal carcinoma in situ of left breast: Secondary | ICD-10-CM | POA: Diagnosis not present

## 2021-07-15 ENCOUNTER — Other Ambulatory Visit: Payer: Self-pay

## 2021-07-15 ENCOUNTER — Ambulatory Visit: Payer: 59

## 2021-07-15 ENCOUNTER — Ambulatory Visit
Admission: RE | Admit: 2021-07-15 | Discharge: 2021-07-15 | Disposition: A | Discharge status: Home / Self Care | Payer: 59 | Source: Ambulatory Visit | Attending: Radiation Oncology | Admitting: Radiation Oncology

## 2021-07-15 DIAGNOSIS — D0512 Intraductal carcinoma in situ of left breast: Secondary | ICD-10-CM | POA: Diagnosis not present

## 2021-07-18 ENCOUNTER — Other Ambulatory Visit: Payer: Self-pay

## 2021-07-18 ENCOUNTER — Ambulatory Visit
Admission: RE | Admit: 2021-07-18 | Discharge: 2021-07-18 | Disposition: A | Discharge status: Home / Self Care | Payer: 59 | Source: Ambulatory Visit | Attending: Radiation Oncology | Admitting: Radiation Oncology

## 2021-07-18 ENCOUNTER — Ambulatory Visit: Payer: 59

## 2021-07-18 DIAGNOSIS — D0512 Intraductal carcinoma in situ of left breast: Secondary | ICD-10-CM | POA: Diagnosis not present

## 2021-07-19 ENCOUNTER — Ambulatory Visit: Payer: 59

## 2021-07-19 ENCOUNTER — Ambulatory Visit
Admission: RE | Admit: 2021-07-19 | Discharge: 2021-07-19 | Disposition: A | Discharge status: Home / Self Care | Payer: 59 | Source: Ambulatory Visit | Attending: Radiation Oncology | Admitting: Radiation Oncology

## 2021-07-19 DIAGNOSIS — D0512 Intraductal carcinoma in situ of left breast: Secondary | ICD-10-CM | POA: Diagnosis not present

## 2021-07-20 ENCOUNTER — Other Ambulatory Visit: Payer: Self-pay

## 2021-07-20 ENCOUNTER — Ambulatory Visit
Admission: RE | Admit: 2021-07-20 | Discharge: 2021-07-20 | Disposition: A | Discharge status: Home / Self Care | Payer: 59 | Source: Ambulatory Visit | Attending: Radiation Oncology | Admitting: Radiation Oncology

## 2021-07-20 ENCOUNTER — Ambulatory Visit: Payer: 59

## 2021-07-20 DIAGNOSIS — D0512 Intraductal carcinoma in situ of left breast: Secondary | ICD-10-CM | POA: Diagnosis not present

## 2021-07-21 ENCOUNTER — Ambulatory Visit: Payer: 59

## 2021-07-21 ENCOUNTER — Ambulatory Visit
Admission: RE | Admit: 2021-07-21 | Discharge: 2021-07-21 | Disposition: A | Discharge status: Home / Self Care | Payer: 59 | Source: Ambulatory Visit | Attending: Radiation Oncology | Admitting: Radiation Oncology

## 2021-07-21 DIAGNOSIS — D0512 Intraductal carcinoma in situ of left breast: Secondary | ICD-10-CM | POA: Diagnosis not present

## 2021-07-22 ENCOUNTER — Ambulatory Visit
Admission: RE | Admit: 2021-07-22 | Discharge: 2021-07-22 | Disposition: A | Discharge status: Home / Self Care | Payer: 59 | Source: Ambulatory Visit | Attending: Radiation Oncology | Admitting: Radiation Oncology

## 2021-07-22 ENCOUNTER — Other Ambulatory Visit: Payer: Self-pay

## 2021-07-22 ENCOUNTER — Ambulatory Visit: Payer: 59

## 2021-07-22 DIAGNOSIS — D0512 Intraductal carcinoma in situ of left breast: Secondary | ICD-10-CM | POA: Diagnosis not present

## 2021-07-25 ENCOUNTER — Ambulatory Visit: Payer: 59

## 2021-07-25 ENCOUNTER — Other Ambulatory Visit: Payer: Self-pay

## 2021-07-25 ENCOUNTER — Ambulatory Visit
Admission: RE | Admit: 2021-07-25 | Discharge: 2021-07-25 | Disposition: A | Discharge status: Home / Self Care | Payer: 59 | Source: Ambulatory Visit | Attending: Radiation Oncology | Admitting: Radiation Oncology

## 2021-07-25 DIAGNOSIS — D0512 Intraductal carcinoma in situ of left breast: Secondary | ICD-10-CM | POA: Diagnosis not present

## 2021-07-26 ENCOUNTER — Ambulatory Visit: Payer: 59

## 2021-07-26 ENCOUNTER — Ambulatory Visit
Admission: RE | Admit: 2021-07-26 | Discharge: 2021-07-26 | Disposition: A | Discharge status: Home / Self Care | Payer: 59 | Source: Ambulatory Visit | Attending: Radiation Oncology | Admitting: Radiation Oncology

## 2021-07-26 DIAGNOSIS — D0512 Intraductal carcinoma in situ of left breast: Secondary | ICD-10-CM | POA: Diagnosis not present

## 2021-07-27 ENCOUNTER — Ambulatory Visit
Admission: RE | Admit: 2021-07-27 | Discharge: 2021-07-27 | Disposition: A | Discharge status: Home / Self Care | Payer: 59 | Source: Ambulatory Visit | Attending: Radiation Oncology | Admitting: Radiation Oncology

## 2021-07-27 ENCOUNTER — Other Ambulatory Visit: Payer: Self-pay

## 2021-07-27 ENCOUNTER — Ambulatory Visit: Payer: 59

## 2021-07-27 DIAGNOSIS — D0512 Intraductal carcinoma in situ of left breast: Secondary | ICD-10-CM | POA: Diagnosis not present

## 2021-07-28 ENCOUNTER — Ambulatory Visit
Admission: RE | Admit: 2021-07-28 | Discharge: 2021-07-28 | Disposition: A | Discharge status: Home / Self Care | Payer: 59 | Source: Ambulatory Visit | Attending: Radiation Oncology | Admitting: Radiation Oncology

## 2021-07-28 ENCOUNTER — Other Ambulatory Visit: Payer: Self-pay

## 2021-07-28 ENCOUNTER — Ambulatory Visit: Payer: 59

## 2021-07-28 DIAGNOSIS — D0512 Intraductal carcinoma in situ of left breast: Secondary | ICD-10-CM | POA: Diagnosis not present

## 2021-07-29 ENCOUNTER — Other Ambulatory Visit: Payer: Self-pay

## 2021-07-29 ENCOUNTER — Ambulatory Visit
Admission: RE | Admit: 2021-07-29 | Discharge: 2021-07-29 | Disposition: A | Discharge status: Home / Self Care | Payer: 59 | Source: Ambulatory Visit | Attending: Radiation Oncology | Admitting: Radiation Oncology

## 2021-07-29 ENCOUNTER — Ambulatory Visit: Payer: 59

## 2021-07-29 DIAGNOSIS — D0512 Intraductal carcinoma in situ of left breast: Secondary | ICD-10-CM | POA: Diagnosis not present

## 2021-08-01 ENCOUNTER — Ambulatory Visit: Payer: 59

## 2021-08-01 ENCOUNTER — Ambulatory Visit
Admission: RE | Admit: 2021-08-01 | Discharge: 2021-08-01 | Disposition: A | Discharge status: Home / Self Care | Payer: 59 | Source: Ambulatory Visit | Attending: Radiation Oncology | Admitting: Radiation Oncology

## 2021-08-01 DIAGNOSIS — Z79899 Other long term (current) drug therapy: Secondary | ICD-10-CM | POA: Diagnosis not present

## 2021-08-01 DIAGNOSIS — Z171 Estrogen receptor negative status [ER-]: Secondary | ICD-10-CM | POA: Diagnosis not present

## 2021-08-01 DIAGNOSIS — D0512 Intraductal carcinoma in situ of left breast: Secondary | ICD-10-CM | POA: Insufficient documentation

## 2021-08-02 ENCOUNTER — Ambulatory Visit: Payer: 59

## 2021-08-02 ENCOUNTER — Other Ambulatory Visit: Payer: Self-pay

## 2021-08-02 ENCOUNTER — Ambulatory Visit
Admission: RE | Admit: 2021-08-02 | Discharge: 2021-08-02 | Disposition: A | Discharge status: Home / Self Care | Payer: 59 | Source: Ambulatory Visit | Attending: Radiation Oncology | Admitting: Radiation Oncology

## 2021-08-02 DIAGNOSIS — D0512 Intraductal carcinoma in situ of left breast: Secondary | ICD-10-CM | POA: Diagnosis not present

## 2021-08-03 ENCOUNTER — Ambulatory Visit: Payer: 59

## 2021-08-03 ENCOUNTER — Ambulatory Visit
Admission: RE | Admit: 2021-08-03 | Discharge: 2021-08-03 | Disposition: A | Discharge status: Home / Self Care | Payer: 59 | Source: Ambulatory Visit | Attending: Radiation Oncology | Admitting: Radiation Oncology

## 2021-08-03 DIAGNOSIS — D0512 Intraductal carcinoma in situ of left breast: Secondary | ICD-10-CM | POA: Diagnosis not present

## 2021-08-04 ENCOUNTER — Ambulatory Visit: Payer: 59

## 2021-08-04 ENCOUNTER — Ambulatory Visit
Admission: RE | Admit: 2021-08-04 | Discharge: 2021-08-04 | Disposition: A | Discharge status: Home / Self Care | Payer: 59 | Source: Ambulatory Visit | Attending: Radiation Oncology | Admitting: Radiation Oncology

## 2021-08-04 ENCOUNTER — Other Ambulatory Visit: Payer: Self-pay

## 2021-08-04 DIAGNOSIS — D0512 Intraductal carcinoma in situ of left breast: Secondary | ICD-10-CM | POA: Diagnosis not present

## 2021-08-05 ENCOUNTER — Ambulatory Visit: Payer: 59 | Admitting: Radiation Oncology

## 2021-08-05 ENCOUNTER — Ambulatory Visit
Admission: RE | Admit: 2021-08-05 | Discharge: 2021-08-05 | Disposition: A | Discharge status: Home / Self Care | Payer: 59 | Source: Ambulatory Visit | Attending: Radiation Oncology | Admitting: Radiation Oncology

## 2021-08-05 ENCOUNTER — Ambulatory Visit: Payer: 59

## 2021-08-05 DIAGNOSIS — D0512 Intraductal carcinoma in situ of left breast: Secondary | ICD-10-CM | POA: Diagnosis not present

## 2021-08-08 ENCOUNTER — Other Ambulatory Visit: Payer: Self-pay

## 2021-08-08 ENCOUNTER — Ambulatory Visit
Admission: RE | Admit: 2021-08-08 | Discharge: 2021-08-08 | Disposition: A | Discharge status: Home / Self Care | Payer: 59 | Source: Ambulatory Visit | Attending: Radiation Oncology | Admitting: Radiation Oncology

## 2021-08-08 ENCOUNTER — Ambulatory Visit: Payer: 59

## 2021-08-08 DIAGNOSIS — D0512 Intraductal carcinoma in situ of left breast: Secondary | ICD-10-CM | POA: Diagnosis not present

## 2021-08-09 ENCOUNTER — Ambulatory Visit
Admission: RE | Admit: 2021-08-09 | Discharge: 2021-08-09 | Disposition: A | Discharge status: Home / Self Care | Payer: 59 | Source: Ambulatory Visit | Attending: Radiation Oncology | Admitting: Radiation Oncology

## 2021-08-09 ENCOUNTER — Ambulatory Visit: Payer: 59

## 2021-08-09 DIAGNOSIS — D0512 Intraductal carcinoma in situ of left breast: Secondary | ICD-10-CM | POA: Diagnosis not present

## 2021-08-10 ENCOUNTER — Ambulatory Visit
Admission: RE | Admit: 2021-08-10 | Discharge: 2021-08-10 | Disposition: A | Discharge status: Home / Self Care | Payer: 59 | Source: Ambulatory Visit | Attending: Radiation Oncology | Admitting: Radiation Oncology

## 2021-08-10 ENCOUNTER — Other Ambulatory Visit: Payer: Self-pay

## 2021-08-10 ENCOUNTER — Ambulatory Visit: Payer: 59

## 2021-08-10 DIAGNOSIS — D0512 Intraductal carcinoma in situ of left breast: Secondary | ICD-10-CM | POA: Diagnosis not present

## 2021-08-10 NOTE — Progress Notes (Signed)
Patient Care Team: [name], NP as PCP - General (Internal Medicine) [name], NP (Internal Medicine) Rockwell Germany, RN as Oncology Nurse Navigator Mauro Kaufmann, RN as Oncology Nurse Navigator  DIAGNOSIS:    ICD-10-CM   1. Ductal carcinoma in situ (DCIS) of left breast  D05.12       SUMMARY OF ONCOLOGIC HISTORY: Oncology History  Ductal carcinoma in situ (DCIS) of left breast  05/24/2021 Initial Diagnosis   Screening mammogram showed possible asymmetry bilaterally. Diagnostic mammogram and US showed subtle architectural distortion involving the outer left breast. Left lumpectomy on 05/24/21 showed 1 mm high grade DCIS with in situ carcinoma 3 mm from the inferior margin.   06/01/2021 Cancer Staging   Staging form: Breast, AJCC 8th Edition - Clinical: Stage 0 (cTis (DCIS), cN0, cM0, G3, ER: Unknown, PR: Unknown, HER2: Not Assessed) - Signed by Nicholas Lose, MD on 06/01/2021 Stage prefix: Initial diagnosis Histologic grading system: 3 grade system   06/29/2021 - 08/12/2021 Radiation Therapy   Adjuvant radiation   07/01/2021 Genetic Testing   Negative hereditary cancer genetic testing: no pathogenic variants detected in [facility] CancerNext-Expanded +RNAinsight Panel.  The report date is July 01, 2021.   The CancerNext-Expanded gene panel offered by [facility] and includes sequencing, rearrangement, and RNA analysis for the following 77 genes: AIP, ALK, APC, ATM, AXIN2, BAP1, BARD1, BLM, BMPR1A, BRCA1, BRCA2, BRIP1, CDC73, CDH1, CDK4, CDKN1B, CDKN2A, CHEK2, CTNNA1, DICER1, FANCC, FH, FLCN, GALNT12, KIF1B, LZTR1, MAX, MEN1, MET, MLH1, MSH2, MSH3, MSH6, MUTYH, NBN, NF1, NF2, NTHL1, PALB2, PHOX2B, PMS2, POT1, PRKAR1A, PTCH1, PTEN, RAD51C, RAD51D, RB1, RECQL, RET, SDHA, SDHAF2, SDHB, SDHC, SDHD, SMAD4, SMARCA4, SMARCB1, SMARCE1, STK11, SUFU, TMEM127, TP53, TSC1, TSC2, VHL and XRCC2 (sequencing and deletion/duplication); EGFR, EGLN1, HOXB13, KIT, MITF, PDGFRA,  POLD1, and POLE (sequencing only); EPCAM and GREM1 (deletion/duplication only).      CHIEF COMPLIANT: Follow-up of left breast DCIS  INTERVAL HISTORY: Colleen Roach is a 45 y.o. with above-mentioned history of left breast DCIS. She presents to the clinic today for follow-up.   She has tolerated the radiation reasonably well.  ALLERGIES:  is allergic to augmentin [amoxicillin-pot clavulanate], fish allergy, ibuprofen, pollen extract, almond (diagnostic), cashew nut oil, eggs or egg-derived products, naproxen sodium, peanut-containing drug products, and shellfish allergy.  MEDICATIONS:  Current Outpatient Medications  Medication Sig Dispense Refill   amLODipine (NORVASC) 5 MG tablet TAKE 1 TABLET BY MOUTH EVERY DAY 90 tablet 1   loratadine (CLARITIN) 10 MG tablet Take 1 tablet (10 mg total) by mouth daily. 90 tablet 3   Multiple Vitamins-Calcium (ONE-A-DAY WOMENS FORMULA) TABS One tab by mouth once daily 90 tablet 3   No current facility-administered medications for this visit.    PHYSICAL EXAMINATION: ECOG PERFORMANCE STATUS: 1 - Symptomatic but completely ambulatory  Vitals:   08/12/21 0822  BP: 133/90  Pulse: 85  Resp: 18  Temp: (!) 97.3 F (36.3 C)  SpO2: 100%   Filed Weights   08/12/21 0822  Weight: 199 lb 9.6 oz (90.5 kg)    BREAST: No palpable masses or nodules in either right or left breasts. No palpable axillary supraclavicular or infraclavicular adenopathy no breast tenderness or nipple discharge. (exam performed in the presence of a chaperone)  LABORATORY DATA:  I have reviewed the data as listed CMP Latest Ref Rng & Units 04/04/2021 10/11/2020 03/19/2019  Glucose 65 - 99 mg/dL 94 - 95  BUN 7 - 25 mg/dL 12 - 12  Creatinine 0.50 - 1.10  mg/dL 0.59 - 0.64  Sodium 135 - 146 mmol/L 138 - 138  Potassium 3.5 - 5.3 mmol/L 4.1 - 4.3  Chloride 98 - 110 mmol/L 103 - 104  CO2 20 - 32 mmol/L 24 - 20  Calcium 8.6 - 10.2 mg/dL 9.0 - 9.5  Total Protein 6.1 - 8.1 g/dL 6.8 7.4  7.3  Total Bilirubin 0.2 - 1.2 mg/dL 0.6 0.5 0.4  AST 10 - 30 U/L 18 33(H) 34(H)  ALT 6 - 29 U/L 40(H) 90(H) 69(H)    Lab Results  Component Value Date   WBC 8.5 03/19/2019   HGB 14.0 03/19/2019   HCT 42.9 03/19/2019   MCV 86.3 03/19/2019   PLT 274 03/19/2019   NEUTROABS 4,395 03/19/2019    ASSESSMENT & PLAN:  Ductal carcinoma in situ (DCIS) of left breast Initial biopsy: Complex sclerosing lesion Left lumpectomy: High-grade DCIS 1 mm, margins negative, ER/PR negative Adjuvant radiation: 06/29/2021-08/12/2021  I discussed with the patient that she does not benefit from adjuvant antiestrogen therapy and therefore we are not recommending tamoxifen. Return to clinic in 3 months for survivorship care plan visit and after that she could be followed by long-term survivorship.    No orders of the defined types were placed in this encounter.  The patient has a good understanding of the overall plan. she agrees with it. she will call with any problems that may develop before the next visit here.  Total time spent: 20 mins including face to face time and time spent for planning, charting and coordination of care  Rulon Eisenmenger, MD, MPH 08/12/2021  I, Thana Ates, am acting as scribe for Dr. Nicholas Lose.  I have reviewed the above documentation for accuracy and completeness, and I agree with the above.

## 2021-08-11 ENCOUNTER — Ambulatory Visit
Admission: RE | Admit: 2021-08-11 | Discharge: 2021-08-11 | Disposition: A | Discharge status: Home / Self Care | Payer: 59 | Source: Ambulatory Visit | Attending: Radiation Oncology | Admitting: Radiation Oncology

## 2021-08-11 ENCOUNTER — Ambulatory Visit: Payer: 59

## 2021-08-11 ENCOUNTER — Encounter: Payer: Self-pay | Admitting: *Deleted

## 2021-08-11 DIAGNOSIS — D0512 Intraductal carcinoma in situ of left breast: Secondary | ICD-10-CM

## 2021-08-12 ENCOUNTER — Ambulatory Visit
Admission: RE | Admit: 2021-08-12 | Discharge: 2021-08-12 | Disposition: A | Discharge status: Home / Self Care | Payer: 59 | Source: Ambulatory Visit | Attending: Radiation Oncology | Admitting: Radiation Oncology

## 2021-08-12 ENCOUNTER — Inpatient Hospital Stay: Payer: 59 | Attending: Hematology and Oncology | Admitting: Hematology and Oncology

## 2021-08-12 ENCOUNTER — Other Ambulatory Visit: Payer: Self-pay

## 2021-08-12 ENCOUNTER — Ambulatory Visit: Payer: 59

## 2021-08-12 ENCOUNTER — Encounter: Payer: Self-pay | Admitting: Radiation Oncology

## 2021-08-12 DIAGNOSIS — Z171 Estrogen receptor negative status [ER-]: Secondary | ICD-10-CM | POA: Insufficient documentation

## 2021-08-12 DIAGNOSIS — D0512 Intraductal carcinoma in situ of left breast: Secondary | ICD-10-CM | POA: Insufficient documentation

## 2021-08-12 DIAGNOSIS — Z79899 Other long term (current) drug therapy: Secondary | ICD-10-CM | POA: Insufficient documentation

## 2021-08-12 NOTE — Assessment & Plan Note (Signed)
Initial biopsy: Complex sclerosing lesion Left lumpectomy: High-grade DCIS 1 mm, margins negative, ER/PR negative Adjuvant radiation: 06/29/2021-08/12/2021  I discussed with the patient that she does not benefit from adjuvant antiestrogen therapy and therefore we are not recommending tamoxifen. Return to clinic in 3 months for survivorship care plan visit and after that she could be followed by long-term survivorship.

## 2021-08-15 ENCOUNTER — Telehealth: Payer: Self-pay | Admitting: Adult Health

## 2021-08-15 NOTE — Progress Notes (Signed)
                                                                                                                                                             Patient Name: Colleen Roach MRN: [id number] DOB: 12/09/1975 Referring Physician: Donnie Mesa (Profile Not Attached) Date of Service: 08/12/2021  [facility] [place]                                                        End Of Treatment Note  Diagnoses: C50.412-Malignant neoplasm of upper-outer quadrant of left female breast  Cancer Staging: High Grade, ER/PR negative DCIS of the left breast.  Intent: Curative  Radiation Treatment Dates: 06/28/2021 through 08/12/2021 Site Technique Total Dose (Gy) Dose per Fx (Gy) Completed Fx Beam Energies  Breast, Left: Breast_Lt 3D 50.4/50.4 1.8 28/28 6XFFF  Breast, Left: Breast_Lt_Bst 3D 10/10 2 5/5 6X   Narrative: The patient tolerated radiation therapy relatively well.She developed fatigue and anticipated skin changes in the treatment field.   Plan: The patient will receive a call in about one month from the radiation oncology department. She will continue follow up with Dr. Lindi Adie as well.   ________________________________________________    Carola Rhine, [facility]

## 2021-08-15 NOTE — Telephone Encounter (Signed)
Scheduled appointment per 10/14 los. Left message with appointment times.

## 2021-09-12 ENCOUNTER — Ambulatory Visit
Admission: RE | Admit: 2021-09-12 | Discharge: 2021-09-12 | Disposition: A | Discharge status: Home / Self Care | Payer: 59 | Source: Ambulatory Visit | Attending: Radiation Oncology | Admitting: Radiation Oncology

## 2021-09-12 DIAGNOSIS — D0512 Intraductal carcinoma in situ of left breast: Secondary | ICD-10-CM

## 2021-09-12 NOTE — Progress Notes (Signed)
  Radiation Oncology         [phone] ________________________________  Name: Colleen Roach MRN: [id number]  Date of Service: 09/12/2021  DOB: Dec 13, 1975  Post Treatment Telephone Note  Diagnosis:   High Grade, ER/PR negative DCIS of the left breast.  Interval Since Last Radiation:  5 weeks    06/28/2021 through 08/12/2021 Site Technique Total Dose (Gy) Dose per Fx (Gy) Completed Fx Beam Energies  Breast, Left: Breast_Lt 3D 50.4/50.4 1.8 28/28 6XFFF  Breast, Left: Breast_Lt_Bst 3D 10/10 2 5/5 6X   Narrative:  The patient was contacted today for routine follow-up. During treatment she did very well with radiotherapy and did not have significant desquamation. She reports she is doing well and feels her skin is healing and she feels that her energy is also improved.  Impression/Plan: 1. High Grade, ER/PR negative DCIS of the left breast.. The patient has been doing well since completion of radiotherapy. We discussed that we would be happy to continue to follow her as needed, but she will also continue to follow up with Dr. Lindi Adie in medical oncology. She was counseled on skin care as well as measures to avoid sun exposure to this area.  2. Survivorship. We discussed the importance of survivorship evaluation and encouraged her to attend her upcoming visit with that clinic.       Carola Rhine, PAC

## 2021-10-10 ENCOUNTER — Encounter: Payer: Self-pay | Admitting: Family

## 2021-10-10 ENCOUNTER — Ambulatory Visit (INDEPENDENT_AMBULATORY_CARE_PROVIDER_SITE_OTHER): Payer: 59 | Admitting: Family

## 2021-10-10 VITALS — BP 126/79 | HR 80 | Temp 98.6°F | Resp 16 | Ht 63.5 in | Wt 199.4 lb

## 2021-10-10 DIAGNOSIS — E039 Hypothyroidism, unspecified: Secondary | ICD-10-CM | POA: Diagnosis not present

## 2021-10-10 DIAGNOSIS — Z0000 Encounter for general adult medical examination without abnormal findings: Secondary | ICD-10-CM

## 2021-10-10 DIAGNOSIS — Z23 Encounter for immunization: Secondary | ICD-10-CM | POA: Diagnosis not present

## 2021-10-10 DIAGNOSIS — D0512 Intraductal carcinoma in situ of left breast: Secondary | ICD-10-CM

## 2021-10-10 DIAGNOSIS — I10 Essential (primary) hypertension: Secondary | ICD-10-CM

## 2021-10-10 DIAGNOSIS — E785 Hyperlipidemia, unspecified: Secondary | ICD-10-CM | POA: Diagnosis not present

## 2021-10-10 DIAGNOSIS — L608 Other nail disorders: Secondary | ICD-10-CM

## 2021-10-10 DIAGNOSIS — Z309 Encounter for contraceptive management, unspecified: Secondary | ICD-10-CM | POA: Insufficient documentation

## 2021-10-10 NOTE — Assessment & Plan Note (Addendum)
Wt Readings from Last 3 Encounters:  10/10/21 199 lb 6.4 oz (90.4 kg)  08/12/21 199 lb 9.6 oz (90.5 kg)  06/01/21 199 lb 3.2 oz (90.4 kg)   Discussed healthy diet, exercise weight loss. Flu shot today. Tetanus up to date. mammo up to date, pap up to date.

## 2021-10-10 NOTE — Assessment & Plan Note (Signed)
New. Will send nail specimen for evaluation for fungus.

## 2021-10-10 NOTE — Addendum Note (Signed)
Addended by: Jiles Prows on: 10/10/2021 11:30 AM   Modules accepted: Orders

## 2021-10-10 NOTE — Assessment & Plan Note (Addendum)
S/p lumpectomy and radiation treatment. Surveillance per oncology.

## 2021-10-10 NOTE — Assessment & Plan Note (Signed)
BP Readings from Last 3 Encounters:  10/10/21 126/79  08/12/21 133/90  06/01/21 (!) 145/85   BP stable on amlodipine 5mg  once daily. Continue same.

## 2021-10-10 NOTE — Patient Instructions (Signed)
GYN number-  [phone]

## 2021-10-10 NOTE — Progress Notes (Signed)
Subjective:   By signing my name below, I, Colleen Roach, attest that this documentation has been prepared under the direction and in the presence of [name] NP. 10/10/2021    Patient ID: Colleen Roach, female    DOB: Mar 28, 1976, 45 y.o.   MRN: [id number]  No chief complaint on file.   HPI Patient is in today for a comprehesive physical exam.   Discoloration of nail- She reports having discoloration on the nail on her right thumb.   Lumpectomy- She completed a lumpectomy this year and completed radiation treatment to that area of her breast due to diagnosis of ductal carcinoma in situ L breast.   She denies having any unexpected weight change, ear pain, hearing loss and rhinorrhea, visual disturbance, cough, chest pain and leg swelling, nausea, vomiting, diarrhea, constipation, blood in stool, or dysuria and frequency, for myalgias and arthralgias, rash, headaches, adenopathy, depression or anxiety at this time. Social history: She has no recent changes to her family medical history. She does not drink alcohol. She does not use drugs. She uses an IUD for birth control.  Immunizations: She is interested in receiving the flu vaccine during this visit. She was informed of the bivalent Covid-19 vaccine.  Diet: She is managing a healthy diet.  Exercise: She participates in exercise by walking at work.  Pap Smear: Last completed 10/11/2020. Results are normal. Repeat in 3 years.  Mammogram: Last completed 01/08/2021. Results showed possible asymmetry in left and right breast which will require further evaluation.  Dental: She is UTD on dental care.  Vision: She is UTD on vision care.    Health Maintenance Due  Topic Date Due   Pneumococcal Vaccine 54-74 Years old (1 - PCV) Never done   COVID-19 Vaccine (3 - Moderna risk series) 02/25/2020   INFLUENZA VACCINE  05/30/2021    Past Medical History:  Diagnosis Date   Back pain    Fatty liver    Hypertension     Past  Surgical History:  Procedure Laterality Date   BREAST LUMPECTOMY WITH RADIOACTIVE SEED LOCALIZATION Left 05/24/2021   Procedure: LEFT BREAST LUMPECTOMY WITH RADIOACTIVE SEED LOCALIZATION;  Surgeon: Donnie Mesa, MD;  Location: [facility];  Service: General;  Laterality: Left;    Family History  Problem Relation Age of Onset   Hypertension Mother    Hypertension Father    Liver cancer Father     Social History   Socioeconomic History   Marital status: Married    Spouse name: Not on file   Number of children: 2   Years of education: Master   Highest education level: Not on file  Occupational History   Occupation: Lab tech  Tobacco Use   Smoking status: Never   Smokeless tobacco: Never  Vaping Use   Vaping Use: Never used  Substance and Sexual Activity   Alcohol use: Never   Drug use: Never   Sexual activity: Yes    Partners: Male    Birth control/protection: I.U.D.    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 [place] Moved to the [place] at age 31 with her husband Enjoys movies, cooking Sister and dad live in [place] Determinants of Health   Financial Resource Strain:  Not on file  Food Insecurity: Not on file  Transportation Needs: Not on file  Physical Activity: Not on file  Stress: Not on file  Social Connections: Not on file  Intimate Partner Violence: Not At Risk   Fear of Current or Ex-Partner: No   Emotionally Abused: No   Physically Abused: No   Sexually Abused: No    Outpatient Medications Prior to Visit  Medication Sig Dispense Refill   amLODipine (NORVASC) 5 MG tablet TAKE 1 TABLET BY MOUTH EVERY DAY 90 tablet 1   loratadine (CLARITIN) 10 MG tablet Take 1 tablet (10 mg total) by mouth daily. 90 tablet 3   Multiple Vitamins-Calcium (ONE-A-DAY WOMENS  FORMULA) TABS One tab by mouth once daily 90 tablet 3   No facility-administered medications prior to visit.    Allergies  Allergen Reactions   Augmentin [Amoxicillin-Pot Clavulanate]     Mucous in throat and diarrhea   Fish Allergy Swelling    Facial Swelling    Ibuprofen     rash   Pollen Extract    Almond (Diagnostic) Rash   Cashew Nut Oil Rash   Eggs Or Egg-Derived Products Rash   Naproxen Sodium Rash   Peanut-Containing Drug Products Rash   Shellfish Allergy Rash    Review of Systems  Constitutional:  Negative for fever.  HENT:  Negative for congestion and sore throat.   Respiratory:  Negative for cough, shortness of breath and wheezing.   Cardiovascular:  Negative for chest pain.  Gastrointestinal:  Negative for blood in stool, constipation, diarrhea, nausea and vomiting.  Genitourinary:  Negative for dysuria, frequency and hematuria.  Musculoskeletal:  Negative for joint pain and myalgias.  Skin:        (-)New moles  Neurological:  Negative for headaches.      Objective:    Physical Exam Constitutional:      General: She is not in acute distress.    Appearance: Normal appearance. She is not ill-appearing.  HENT:     Head: Normocephalic and atraumatic.     Right Ear: Tympanic membrane, ear canal and external ear normal.     Left Ear: Tympanic membrane, ear canal and external ear normal.  Eyes:     Extraocular Movements: Extraocular movements intact.     Right eye: No nystagmus.     Left eye: No nystagmus.     Pupils: Pupils are equal, round, and reactive to light.  Cardiovascular:     Rate and Rhythm: Normal rate and regular rhythm.     Heart sounds: Normal heart sounds. No murmur heard.   No gallop.  Pulmonary:     Effort: Pulmonary effort is normal. No respiratory distress.     Breath sounds: Normal breath sounds. No wheezing or rales.  Abdominal:     General: There is no distension.     Palpations: Abdomen is soft.     Tenderness: There is no  abdominal tenderness. There is no guarding.  Musculoskeletal:     Comments: 5/5 strength in both upper and lower extremities  Skin:    General: Skin is warm and dry.     Comments: Mild discoloration of right distal thumbnail  Neurological:     Mental Status: She is alert and oriented to person, place, and time.     Deep Tendon Reflexes:     Reflex Scores:      Patellar reflexes are 2+ on the right side and 2+ on the left side. Psychiatric:  Behavior: Behavior normal.        Judgment: Judgment normal.    BP 126/79 (BP Location: Right Arm, Patient Position: Sitting, Cuff Size: Large)   Pulse 80   Temp 98.6 F (37 C) (Oral)   Resp 16   Ht 5' 3.5" (1.613 m)   Wt 199 lb 6.4 oz (90.4 kg)   SpO2 100%   BMI 34.77 kg/m  Wt Readings from Last 3 Encounters:  10/10/21 199 lb 6.4 oz (90.4 kg)  08/12/21 199 lb 9.6 oz (90.5 kg)  06/01/21 199 lb 3.2 oz (90.4 kg)       Assessment & Plan:   Problem List Items Addressed This Visit       Unprioritized   Preventative health care - Primary    Wt Readings from Last 3 Encounters:  10/10/21 199 lb 6.4 oz (90.4 kg)  08/12/21 199 lb 9.6 oz (90.5 kg)  06/01/21 199 lb 3.2 oz (90.4 kg)  Discussed healthy diet, exercise weight loss. Flu shot today. Tetanus up to date. mammo up to date, pap up to date.       Nail discoloration    New. Will send nail specimen for evaluation for fungus.      Essential hypertension    BP Readings from Last 3 Encounters:  10/10/21 126/79  08/12/21 133/90  06/01/21 (!) 145/85  BP stable on amlodipine 30m once daily. Continue same.       Relevant Orders   Comp Met (CMET)   Ductal carcinoma in situ (DCIS) of left breast    S/p lumpectomy and radiation treatment. Surveillance per oncology.       Other Visit Diagnoses     Hypothyroidism, unspecified type       Relevant Orders   TSH   T3, free   T4, free   Hyperlipidemia, unspecified hyperlipidemia type       Relevant Orders   Lipid panel         No orders of the defined types were placed in this encounter.   I, MDebbrah AlarNP, personally preformed the services described in this documentation.  All medical record entries made by the scribe were at my direction and in my presence.  I have reviewed the chart and discharge instructions (if applicable) and agree that the record reflects my personal performance and is accurate and complete. 10/10/2021   I,Colleen Roach,acting as a sEducation administratorfor MNance Pear NP.,have documented all relevant documentation on the behalf of MNance Pear NP,as directed by  MNance Pear NP while in the presence of MNance Pear NP.   MNance Pear NP

## 2021-10-11 LAB — LIPID PANEL
Cholesterol: 205 mg/dL — ABNORMAL HIGH (ref <200)
HDL: 43 mg/dL — ABNORMAL LOW (ref >=50)
LDL Cholesterol (Calc): 142 mg/dL (calc) — ABNORMAL HIGH
Non-HDL Cholesterol (Calc): 162 mg/dL (calc) — ABNORMAL HIGH (ref <130)
Total CHOL/HDL Ratio: 4.8 (calc) (ref <5.0)
Triglycerides: 91 mg/dL (ref <150)

## 2021-10-11 LAB — COMPREHENSIVE METABOLIC PANEL
AG Ratio: 1.6 (calc) (ref 1.0–2.5)
ALT: 65 U/L — ABNORMAL HIGH (ref 6–29)
AST: 27 U/L (ref 10–30)
Albumin: 4.4 g/dL (ref 3.6–5.1)
Alkaline phosphatase (APISO): 53 U/L (ref 31–125)
BUN: 18 mg/dL (ref 7–25)
CO2: 24 mmol/L (ref 20–32)
Calcium: 9.3 mg/dL (ref 8.6–10.2)
Chloride: 104 mmol/L (ref 98–110)
Creat: 0.65 mg/dL (ref 0.50–0.99)
Globulin: 2.8 g/dL (calc) (ref 1.9–3.7)
Glucose, Bld: 94 mg/dL (ref 65–99)
Potassium: 4.2 mmol/L (ref 3.5–5.3)
Sodium: 137 mmol/L (ref 135–146)
Total Bilirubin: 0.4 mg/dL (ref 0.2–1.2)
Total Protein: 7.2 g/dL (ref 6.1–8.1)

## 2021-10-11 LAB — T4, FREE: Free T4: 1.1 ng/dL (ref 0.8–1.8)

## 2021-10-11 LAB — T3, FREE: T3, Free: 3.3 pg/mL (ref 2.3–4.2)

## 2021-10-11 LAB — TSH: TSH: 2.9 mIU/L

## 2021-10-14 ENCOUNTER — Telehealth: Payer: Self-pay | Admitting: Family

## 2021-10-14 NOTE — Telephone Encounter (Signed)
See mychart.  

## 2021-10-20 ENCOUNTER — Telehealth: Payer: Self-pay | Admitting: Family

## 2021-10-20 DIAGNOSIS — Z5181 Encounter for therapeutic drug level monitoring: Secondary | ICD-10-CM

## 2021-10-20 NOTE — Telephone Encounter (Signed)
See mychart.  

## 2021-11-04 ENCOUNTER — Telehealth: Payer: Self-pay | Admitting: *Deleted

## 2021-11-07 LAB — CULT, FUNGUS, SKIN,HAIR,NAIL W/KOH
MICRO NUMBER:: 12744340
SMEAR:: NONE SEEN
SPECIMEN QUALITY:: ADEQUATE

## 2021-11-11 ENCOUNTER — Encounter: Payer: 59 | Admitting: Adult Health

## 2021-12-02 ENCOUNTER — Telehealth: Payer: Self-pay | Admitting: *Deleted

## 2021-12-05 ENCOUNTER — Encounter: Payer: Self-pay | Admitting: Adult Health

## 2021-12-05 ENCOUNTER — Inpatient Hospital Stay: Payer: 59 | Attending: Adult Health | Admitting: Adult Health

## 2021-12-05 ENCOUNTER — Other Ambulatory Visit: Payer: Self-pay

## 2021-12-05 VITALS — BP 139/78 | HR 82 | Temp 97.7°F | Resp 18 | Ht 63.5 in | Wt 199.5 lb

## 2021-12-05 DIAGNOSIS — I10 Essential (primary) hypertension: Secondary | ICD-10-CM | POA: Diagnosis not present

## 2021-12-05 DIAGNOSIS — K760 Fatty (change of) liver, not elsewhere classified: Secondary | ICD-10-CM | POA: Diagnosis not present

## 2021-12-05 DIAGNOSIS — D0512 Intraductal carcinoma in situ of left breast: Secondary | ICD-10-CM

## 2021-12-05 DIAGNOSIS — Z79899 Other long term (current) drug therapy: Secondary | ICD-10-CM | POA: Insufficient documentation

## 2021-12-05 DIAGNOSIS — Z86000 Personal history of in-situ neoplasm of breast: Secondary | ICD-10-CM | POA: Diagnosis not present

## 2021-12-05 NOTE — Progress Notes (Signed)
SURVIVORSHIP VISIT:   BRIEF ONCOLOGIC HISTORY:  Oncology History  Ductal carcinoma in situ (DCIS) of left breast  01/31/2021 Mammogram   CLINICAL DATA:  Recall from screening mammography, possible focal asymmetries involving both breasts.   EXAM: DIGITAL DIAGNOSTIC BILATERAL MAMMOGRAM WITH TOMOSYNTHESIS AND CAD; ULTRASOUND RIGHT BREAST LIMITED; ULTRASOUND LEFT BREAST LIMITED  IMPRESSION: 1. Subtle architectural distortion involving the OUTER LEFT breast without sonographic correlate. 2. No mammographic or sonographic evidence of malignancy involving the RIGHT breast.   02/07/2021 Initial Biopsy   Diagnosis  Breast, left, needle core biopsy, lateral  - COMPLEX SCLEROSING LESION WITH USUAL DUCTAL HYPERPLASIA  - FIBROCYSTIC CHANGE  - NEGATIVE FOR CARCINOMA   05/24/2021 Definitive Surgery   FINAL MICROSCOPIC DIAGNOSIS:   A. BREAST, LEFT, LUMPECTOMY:  - Focal high grade ductal carcinoma in situ, spanning 1 mm.  - In situ carcinoma is 3 mm from the inferior margin.  - Biopsy site.  - Complex sclerosing lesion with intraductal papilloma.  - Atypical ductal hyperplasia.  - Fibrocystic change and usual ductal hyperplasia.  - See oncology table.   ADDENDUM:  PROGNOSTIC INDICATOR RESULTS:  Estrogen Receptor:       NEGATIVE  Progesterone Receptor:   NEGATIVE    06/01/2021 Cancer Staging   Staging form: Breast, AJCC 8th Edition - Clinical: Stage 0 (cTis (DCIS), cN0, cM0, G3, ER: Unknown, PR: Unknown, HER2: Not Assessed) - Signed by Nicholas Lose, MD on 06/01/2021 Stage prefix: Initial diagnosis Histologic grading system: 3 grade system    06/20/2021 Genetic Testing   Negative hereditary cancer genetic testing: no pathogenic variants detected in [facility] CancerNext-Expanded +RNAinsight Panel.  The report date is July 01, 2021.   The CancerNext-Expanded gene panel offered by [facility] and includes sequencing, rearrangement, and RNA analysis for the following 77 genes: AIP, ALK,  APC, ATM, AXIN2, BAP1, BARD1, BLM, BMPR1A, BRCA1, BRCA2, BRIP1, CDC73, CDH1, CDK4, CDKN1B, CDKN2A, CHEK2, CTNNA1, DICER1, FANCC, FH, FLCN, GALNT12, KIF1B, LZTR1, MAX, MEN1, MET, MLH1, MSH2, MSH3, MSH6, MUTYH, NBN, NF1, NF2, NTHL1, PALB2, PHOX2B, PMS2, POT1, PRKAR1A, PTCH1, PTEN, RAD51C, RAD51D, RB1, RECQL, RET, SDHA, SDHAF2, SDHB, SDHC, SDHD, SMAD4, SMARCA4, SMARCB1, SMARCE1, STK11, SUFU, TMEM127, TP53, TSC1, TSC2, VHL and XRCC2 (sequencing and deletion/duplication); EGFR, EGLN1, HOXB13, KIT, MITF, PDGFRA, POLD1, and POLE (sequencing only); EPCAM and GREM1 (deletion/duplication only).    06/29/2021 - 08/12/2021 Radiation Therapy   Site Technique Total Dose (Gy) Dose per Fx (Gy) Completed Fx Beam Energies  Breast, Left: Breast_Lt 3D 50.4/50.4 1.8 28/28 6XFFF  Breast, Left: Breast_Lt_Bst 3D 10/10 2 5/5 6X     11/04/2021 Cancer Staging   Staging form: Breast, AJCC 8th Edition - Pathologic: Stage 0 (pTis (DCIS), pN0, cM0, G3, ER-, PR-) - Signed by Gardenia Phlegm, NP on 11/04/2021 Stage prefix: Initial diagnosis Histologic grading system: 3 grade system      INTERVAL HISTORY:  Colleen Roach to review her survivorship care plan detailing her treatment course for breast cancer, as well as monitoring long-term side effects of that treatment, education regarding health maintenance, screening, and overall wellness and health promotion.     Overall, Colleen Roach reports feeling quite well.  Her only concern is some swelling at her breast that has been present since completing radiation therapy.  She also notes that it is slightly warm.  She denies any other issues and is feeling quite well today.  She has gone back to work at [employer] and has minimal issues with this.  REVIEW OF SYSTEMS:  Review of Systems  Constitutional:  Negative for appetite change, chills, fatigue, fever and unexpected weight change.  HENT:   Negative for hearing loss, lump/mass and trouble swallowing.   Eyes:  Negative for  eye problems and icterus.  Respiratory:  Negative for chest tightness, cough and shortness of breath.   Cardiovascular:  Negative for chest pain, leg swelling and palpitations.  Gastrointestinal:  Negative for abdominal distention, abdominal pain, constipation, diarrhea, nausea and vomiting.  Endocrine: Negative for hot flashes.  Genitourinary:  Negative for difficulty urinating.   Musculoskeletal:  Negative for arthralgias.  Skin:  Negative for itching and rash.  Neurological:  Negative for dizziness, extremity weakness, headaches and numbness.  Hematological:  Negative for adenopathy. Does not bruise/bleed easily.  Psychiatric/Behavioral:  Negative for depression. The patient is not nervous/anxious.   Breast: Denies any new nodularity, masses, tenderness, nipple changes, or nipple discharge.      ONCOLOGY TREATMENT TEAM:  1. Surgeon:  Dr. [name] at [facility] 2. Medical Oncologist: Dr. Lindi Adie  3. Radiation Oncologist: Dr. Lisbeth Renshaw    PAST MEDICAL/SURGICAL HISTORY:  Past Medical History:  Diagnosis Date   Back pain    Fatty liver    Hypertension    Past Surgical History:  Procedure Laterality Date   BREAST LUMPECTOMY WITH RADIOACTIVE SEED LOCALIZATION Left 05/24/2021   Procedure: LEFT BREAST LUMPECTOMY WITH RADIOACTIVE SEED LOCALIZATION;  Surgeon: Donnie Mesa, MD;  Location: [facility];  Service: General;  Laterality: Left;     ALLERGIES:  Allergies  Allergen Reactions   Augmentin [Amoxicillin-Pot Clavulanate]     Mucous in throat and diarrhea   Fish Allergy Swelling    Facial Swelling    Ibuprofen     rash   Pollen Extract    Almond (Diagnostic) Rash   Cashew Nut Oil Rash   Eggs Or Egg-Derived Products Rash   Naproxen Sodium Rash   Peanut-Containing Drug Products Rash   Shellfish Allergy Rash     CURRENT MEDICATIONS:  Outpatient Encounter Medications as of 12/05/2021  Medication Sig   amLODipine (NORVASC) 5 MG tablet TAKE 1 TABLET  BY MOUTH EVERY DAY   Multiple Vitamins-Calcium (ONE-A-DAY WOMENS FORMULA) TABS One tab by mouth once daily   loratadine (CLARITIN) 10 MG tablet Take 1 tablet (10 mg total) by mouth daily. (Patient not taking: Reported on 12/05/2021)   No facility-administered encounter medications on file as of 12/05/2021.     ONCOLOGIC FAMILY HISTORY:  Family History  Problem Relation Age of Onset   Hypertension Mother    Hypertension Father    Liver cancer Father      GENETIC COUNSELING/TESTING: See above  SOCIAL HISTORY:  Social History   Socioeconomic History   Marital status: Married    Spouse name: Not on file   Number of children: 2   Years of education: Master   Highest education level: Not on file  Occupational History   Occupation: Quarry manager  Tobacco Use   Smoking status: Never   Smokeless tobacco: Never  Vaping Use   Vaping Use: Never used  Substance and Sexual Activity   Alcohol use: Never   Drug use: Never   Sexual activity: Yes    Partners: Male    Birth control/protection: I.U.D.    Comment: Mirena   Other Topics Concern   Not on file  Social History Narrative   ** Merged History Encounter **       Two daughters- 2006 and 200[address] Works as a Data processing manager for [employer]  Grew up in [place] Moved to the [place] at age 28 with her husband Enjoys movies, cooking Sister and dad live in [place]      Social Determinants of Health   Financial Resource Strain: Low Risk    Difficulty of Paying Living Expenses: Not hard at all  Food Insecurity: No Food Insecurity   Worried About Charity fundraiser in the Last Year: Never true   Arboriculturist in the Last Year: Never true  Transportation Needs: No Transportation Needs   Lack of Transportation (Medical): No   Lack of Transportation (Non-Medical): No  Physical Activity: Inactive   Days of Exercise per Week: 0 days   Minutes of Exercise per Session: 0 min  Stress: No Stress Concern Present   Feeling of  Stress : Only a little  Social Connections: Moderately Integrated   Frequency of Communication with Friends and Family: More than three times a week   Frequency of Social Gatherings with Friends and Family: Three times a week   Attends Religious Services: 1 to 4 times per year   Active Member of Clubs or Organizations: No   Attends Archivist Meetings: Never   Marital Status: Married  Human resources officer Violence: Not At Risk   Fear of Current or Ex-Partner: No   Emotionally Abused: No   Physically Abused: No   Sexually Abused: No     OBSERVATIONS/OBJECTIVE:  BP 139/78 (BP Location: Left Arm, Patient Position: Sitting)    Pulse 82    Temp 97.7 F (36.5 C) (Temporal)    Resp 18    Ht 5' 3.5" (1.613 m)    Wt 199 lb 8 oz (90.5 kg)    SpO2 100%    BMI 34.79 kg/m  GENERAL: Patient is a well appearing female in no acute distress HEENT:  Sclerae anicteric.  Oropharynx clear and moist. No ulcerations or evidence of oropharyngeal candidiasis. Neck is supple.  NODES:  No cervical, supraclavicular, or axillary lymphadenopathy palpated.  BREAST EXAM: Right breast is benign.  Left breast is status postlumpectomy and radiation there is some slight warmth to the breast consistent with radiation changes there is no erythema there is no tenderness there are no signs of infection. LUNGS:  Clear to auscultation bilaterally.  No wheezes or rhonchi. HEART:  Regular rate and rhythm. No murmur appreciated. ABDOMEN:  Soft, nontender.  Positive, normoactive bowel sounds. No organomegaly palpated. MSK:  No focal spinal tenderness to palpation. Full range of motion bilaterally in the upper extremities. EXTREMITIES:  No peripheral edema.   SKIN:  Clear with no obvious rashes or skin changes. No nail dyscrasia. NEURO:  Nonfocal. Well oriented.  Appropriate affect.   LABORATORY DATA:  None for this visit.  DIAGNOSTIC IMAGING:  None for this visit.      ASSESSMENT AND PLAN:  Ms.. Roach is a pleasant  46 y.o. female with Stage 0 left breast DCIS, ER-/PR-, diagnosed in 05/2021, treated with lumpectomy and adjuvant radiation therapy.  She presents to the Survivorship Clinic for our initial meeting and routine follow-up post-completion of treatment for breast cancer.    1. Stage IA left breast cancer:  Colleen Roach is continuing to recover from definitive treatment for breast cancer. She will follow-up with Dr. [name] in 6 months with history and physical exam per surveillance protocol, we will see her back in 1 year for follow-up.  Her mammogram is due March 2023; orders placed today. Today, a comprehensive survivorship care plan and treatment  summary was reviewed with the patient today detailing her breast cancer diagnosis, treatment course, potential late/long-term effects of treatment, appropriate follow-up care with recommendations for the future, and patient education resources.  A copy of this summary, along with a letter will be sent to the patients primary care provider via mail/fax/In Basket message after todays visit.    2.  Radiation changes to the breast: I recommended that she massage the breast with vitamin E oil daily.  This should improve her changes.  If this persists or if her symptoms worsen she was instructed to call so that we can follow-up on the concerns.  3. Bone health:  She was given education on specific activities to promote bone health.  4. Cancer screening:  Due to Colleen Roach's history and her age, she should receive screening for skin cancers, colon cancer (beginning in November), and gynecologic cancers.  The information and recommendations are listed on the patient's comprehensive care plan/treatment summary and were reviewed in detail with the patient.    5. Health maintenance and wellness promotion: Colleen Roach was encouraged to consume 5-7 servings of fruits and vegetables per day. We reviewed the "Nutrition Rainbow" handout.  She was also encouraged to engage in moderate to  vigorous exercise for 30 minutes per day most days of the week. We discussed the [employer] fitness program, which is designed for cancer survivors to help them become more physically fit after cancer treatments.  She was instructed to limit her alcohol consumption and continue to abstain from tobacco use.     6. Support services/counseling: It is not uncommon for this period of the patient's cancer care trajectory to be one of many emotions and stressors.  We discussed how this can be increasingly difficult during the times of quarantine and social distancing due to the COVID-19 pandemic.   She was given information regarding our available services and encouraged to contact me with any questions or for help enrolling in any of our support group/programs.    Follow up instructions:    -Return to cancer center in 1 year for continued follow-up and surveillance -Mammogram due in 12/2021 -Follow up with Dr. [name] in 6 months. -She is welcome to return back to the Survivorship Clinic at any time; no additional follow-up needed at this time.  -Consider referral back to survivorship as a long-term survivor for continued surveillance  The patient was provided an opportunity to ask questions and all were answered. The patient agreed with the plan and demonstrated an understanding of the instructions.   Total encounter time: 30 minutes in face-to-face visit time, chart review, lab review, care coordination, and documentation of the encounter.    Wilber Bihari, NP 12/05/21 9:11 AM Medical Oncology and Hematology [facility] [place], [place] [address] Tel. [phone]    Fax. [phone]  *Total Encounter Time as defined by the Centers for Medicare and Medicaid Services includes, in addition to the face-to-face time of a patient visit (documented in the note above) non-face-to-face time: obtaining and reviewing outside history, ordering and reviewing medications, tests  or procedures, care coordination (communications with other health care professionals or caregivers) and documentation in the medical record.

## 2021-12-06 ENCOUNTER — Telehealth: Payer: Self-pay | Admitting: Adult Health

## 2021-12-06 NOTE — Telephone Encounter (Signed)
Scheduled appointment per 2/6 los. Left message. Patient will be mailed an updated calendar.

## 2022-01-09 ENCOUNTER — Ambulatory Visit
Admission: RE | Admit: 2022-01-09 | Discharge: 2022-01-09 | Disposition: A | Discharge status: Home / Self Care | Payer: 59 | Source: Ambulatory Visit | Attending: Adult Health | Admitting: Adult Health

## 2022-01-09 DIAGNOSIS — D0512 Intraductal carcinoma in situ of left breast: Secondary | ICD-10-CM

## 2022-01-09 HISTORY — DX: Personal history of irradiation: Z92.3

## 2022-01-09 HISTORY — DX: Malignant (primary) neoplasm, unspecified: C80.1

## 2022-02-08 ENCOUNTER — Encounter (HOSPITAL_COMMUNITY): Payer: Self-pay

## 2022-03-01 ENCOUNTER — Other Ambulatory Visit: Payer: Self-pay | Admitting: Family

## 2022-07-10 ENCOUNTER — Ambulatory Visit (INDEPENDENT_AMBULATORY_CARE_PROVIDER_SITE_OTHER): Payer: 59 | Admitting: Family

## 2022-07-10 VITALS — BP 135/86 | HR 71 | Temp 97.9°F | Resp 16 | Wt 202.0 lb

## 2022-07-10 DIAGNOSIS — I10 Essential (primary) hypertension: Secondary | ICD-10-CM

## 2022-07-10 DIAGNOSIS — Z23 Encounter for immunization: Secondary | ICD-10-CM | POA: Diagnosis not present

## 2022-07-10 DIAGNOSIS — E559 Vitamin D deficiency, unspecified: Secondary | ICD-10-CM

## 2022-07-10 DIAGNOSIS — Z91018 Allergy to other foods: Secondary | ICD-10-CM

## 2022-07-10 MED ORDER — AMLODIPINE BESYLATE 5 MG PO TABS: 5.0000 mg | ORAL_TABLET | Freq: Every day | ORAL | 1 refills | Status: DC

## 2022-07-10 MED ORDER — EPINEPHRINE 0.3 MG/0.3ML IJ SOAJ: 0.3000 mg | INTRAMUSCULAR | 1 refills | Status: DC | PRN

## 2022-07-10 NOTE — Patient Instructions (Signed)
Please check your blood pressure once daily for 3 days and send me your readings via mychart.

## 2022-07-10 NOTE — Addendum Note (Signed)
Addended by: Jiles Prows on: 07/10/2022 07:39 AM   Modules accepted: Orders

## 2022-07-10 NOTE — Assessment & Plan Note (Signed)
Stable. Rx sent for epipen to have on hand.

## 2022-07-10 NOTE — Assessment & Plan Note (Signed)
Blood pressure is borderline today. I advised her to check her pressure once daily for 3 days and send me her readings via mychart. Continue amlodipine '5mg'$  once daily.

## 2022-07-10 NOTE — Assessment & Plan Note (Signed)
Taking an MVI. Will obtain follow up vit D level.

## 2022-07-10 NOTE — Progress Notes (Signed)
Subjective:     Patient ID: Colleen Roach, female    DOB: 02-Aug-1976, 46 y.o.   MRN: [id number]  Chief Complaint  Patient presents with   Hypertension    Here for follow up    Hypertension   Patient is in today for follow up.    HTN- on amlodipine, reports good compliance.   Allergies-reports symptoms are stable as long as Colleen Roach avoids seafood. Colleen Roach has an epipen but not sure if it is expired.   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   Cancer ([facility])    Fatty liver    Hypertension    Personal history of radiation therapy     Past Surgical History:  Procedure Laterality Date   BREAST LUMPECTOMY WITH RADIOACTIVE SEED LOCALIZATION Left 05/24/2021   Procedure: LEFT BREAST LUMPECTOMY WITH RADIOACTIVE SEED LOCALIZATION;  Surgeon: Donnie Mesa, MD;  Location: [facility];  Service: General;  Laterality: Left;    Family History  Problem Relation Age of Onset   Hypertension Mother    Hypertension Father    Liver cancer Father     Social History   Socioeconomic History   Marital status: Married    Spouse name: Not on file   Number of children: 2   Years of education: Master   Highest education level: Not on file  Occupational History   Occupation: Lab tech  Tobacco Use   Smoking status: Never   Smokeless tobacco: Never  Vaping Use   Vaping Use: Never used  Substance and Sexual Activity   Alcohol use: Never   Drug use: Never   Sexual activity: Yes    Partners: Male    Birth control/protection: I.U.D.    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 [place] Moved to the [place] at age 4  with her husband Enjoys movies, cooking Sister and dad live in [place] Determinants of Health   Financial Resource Strain: Low Risk  (12/05/2021)   Overall Financial Resource Strain (CARDIA)    Difficulty of Paying Living Expenses: Not hard at all  Food Insecurity: No Food Insecurity (12/05/2021)   Hunger Vital Sign    Worried About Running Out of Food in the Last Year: Never true    [place] in the Last Year: Never true  Transportation Needs: No Transportation Needs (12/05/2021)   PRAPARE - Hydrologist (Medical): No    Lack of Transportation (Non-Medical): No  Physical Activity: Inactive (12/05/2021)   Exercise Vital Sign    Days of Exercise per Week: 0 days    Minutes of Exercise per Session: 0 min  Stress: No Stress Concern Present (12/05/2021)   [place]    Feeling of Stress : Only a little  Social Connections: Moderately Integrated (12/05/2021)   Social Connection and Isolation Panel [NHANES]    Frequency of Communication with Friends and Family: More than three times a week  Frequency of Social Gatherings with Friends and Family: Three times a week    Attends Religious Services: 1 to 4 times per year    Active Member of Clubs or Organizations: No    Attends Archivist Meetings: Never    Marital Status: Married  Human resources officer Violence: Not At Risk (12/05/2021)   Humiliation, Afraid, Rape, and Kick questionnaire    Fear of Current or Ex-Partner: No    Emotionally Abused: No    Physically Abused: No    Sexually Abused: No    Outpatient Medications Prior to Visit  Medication Sig Dispense Refill   loratadine (CLARITIN) 10 MG tablet Take 1 tablet (10 mg total) by mouth daily. 90 tablet 3   Multiple Vitamins-Calcium (ONE-A-DAY WOMENS FORMULA) TABS One tab by mouth once daily 90 tablet 3   amLODipine (NORVASC) 5 MG tablet TAKE 1 TABLET BY MOUTH EVERY DAY  90 tablet 1   No facility-administered medications prior to visit.    Allergies  Allergen Reactions   Augmentin [Amoxicillin-Pot Clavulanate]     Mucous in throat and diarrhea   Fish Allergy Swelling    Facial Swelling    Ibuprofen     rash   Pollen Extract    Almond (Diagnostic) Rash   Cashew Nut Oil Rash   Eggs Or Egg-Derived Products Rash   Naproxen Sodium Rash   Peanut-Containing Drug Products Rash   Shellfish Allergy Rash    ROS See HPI    Objective:    Physical Exam Constitutional:      General: Colleen Roach is not in acute distress.    Appearance: Normal appearance. Colleen Roach is well-developed.  HENT:     Head: Normocephalic and atraumatic.     Right Ear: External ear normal.     Left Ear: External ear normal.  Eyes:     General: No scleral icterus. Neck:     Thyroid: No thyromegaly.  Cardiovascular:     Rate and Rhythm: Normal rate and regular rhythm.     Heart sounds: Normal heart sounds. No murmur heard. Pulmonary:     Effort: Pulmonary effort is normal. No respiratory distress.     Breath sounds: Normal breath sounds. No wheezing.  Musculoskeletal:     Cervical back: Neck supple.  Skin:    General: Skin is warm and dry.  Neurological:     Mental Status: Colleen Roach is alert and oriented to person, place, and time.  Psychiatric:        Mood and Affect: Mood normal.        Behavior: Behavior normal.        Thought Content: Thought content normal.        Judgment: Judgment normal.     BP 135/86   Pulse 71   Temp 97.9 F (36.6 C) (Oral)   Resp 16   Wt 202 lb (91.6 kg)   SpO2 99%   BMI 35.22 kg/m  Wt Readings from Last 3 Encounters:  07/10/22 202 lb (91.6 kg)  12/05/21 199 lb 8 oz (90.5 kg)  10/10/21 199 lb 6.4 oz (90.4 kg)       Assessment & Plan:   Problem List Items Addressed This Visit       Unprioritized   Vitamin D deficiency    Taking an MVI. Will obtain follow up vit D level.       Relevant Orders   Vitamin D (25 hydroxy)   Food allergy     Stable. Rx sent for epipen  to have on hand.       Other Visit Diagnoses     Primary hypertension    -  Primary   Relevant Medications   amLODipine (NORVASC) 5 MG tablet   EPINEPHrine (EPIPEN 2-PAK) 0.3 mg/0.3 mL IJ SOAJ injection   Other Relevant Orders   Basic metabolic panel       I have changed Analys K. Pennix's amLODipine. I am also having her start on EPINEPHrine. Additionally, I am having her maintain her loratadine and One-A-Day [employer].  Meds ordered this encounter  Medications   amLODipine (NORVASC) 5 MG tablet    Sig: Take 1 tablet (5 mg total) by mouth daily.    Dispense:  90 tablet    Refill:  1    Order Specific Question:   Supervising Provider    Answer:   Penni Homans A [[id number]]   EPINEPHrine (EPIPEN 2-PAK) 0.3 mg/0.3 mL IJ SOAJ injection    Sig: Inject 0.3 mg into the muscle as needed for anaphylaxis.    Dispense:  1 each    Refill:  1    Order Specific Question:   Supervising Provider    Answer:   Penni Homans A [id number]

## 2022-07-11 ENCOUNTER — Telehealth: Payer: Self-pay | Admitting: Family

## 2022-07-11 DIAGNOSIS — E559 Vitamin D deficiency, unspecified: Secondary | ICD-10-CM

## 2022-07-11 LAB — BASIC METABOLIC PANEL
BUN: 13 mg/dL (ref 7–25)
CO2: 23 mmol/L (ref 20–32)
Calcium: 9.2 mg/dL (ref 8.6–10.2)
Chloride: 105 mmol/L (ref 98–110)
Creat: 0.74 mg/dL (ref 0.50–0.99)
Glucose, Bld: 104 mg/dL — ABNORMAL HIGH (ref 65–99)
Potassium: 4.3 mmol/L (ref 3.5–5.3)
Sodium: 140 mmol/L (ref 135–146)

## 2022-07-11 LAB — VITAMIN D 25 HYDROXY (VIT D DEFICIENCY, FRACTURES): Vit D, 25-Hydroxy: 14 ng/mL — ABNORMAL LOW (ref 30–100)

## 2022-07-11 MED ORDER — VITAMIN D (ERGOCALCIFEROL) 1.25 MG (50000 UNIT) PO CAPS
50000.0000 [IU] | ORAL_CAPSULE | ORAL | 0 refills | Status: DC
Start: 2022-07-11 — End: 2022-10-09

## 2022-07-11 NOTE — Telephone Encounter (Signed)
Vitamin D level is low.  Advise patient to begin vit D 50000 units once weekly for 12 weeks, then repeat vit D level (dx Vit D deficiency).     

## 2022-07-12 NOTE — Telephone Encounter (Signed)
Lvm for patient to call back

## 2022-07-13 NOTE — Telephone Encounter (Signed)
Patient advised of results and new medication, she will follow up in December as scheduled

## 2022-10-09 ENCOUNTER — Ambulatory Visit (INDEPENDENT_AMBULATORY_CARE_PROVIDER_SITE_OTHER): Payer: 59 | Admitting: Family

## 2022-10-09 ENCOUNTER — Encounter: Payer: Self-pay | Admitting: Family

## 2022-10-09 VITALS — BP 137/79 | HR 72 | Temp 98.2°F | Resp 18 | Ht 63.5 in | Wt 205.0 lb

## 2022-10-09 DIAGNOSIS — E559 Vitamin D deficiency, unspecified: Secondary | ICD-10-CM | POA: Diagnosis not present

## 2022-10-09 DIAGNOSIS — E785 Hyperlipidemia, unspecified: Secondary | ICD-10-CM

## 2022-10-09 DIAGNOSIS — Z1211 Encounter for screening for malignant neoplasm of colon: Secondary | ICD-10-CM

## 2022-10-09 DIAGNOSIS — R7303 Prediabetes: Secondary | ICD-10-CM | POA: Diagnosis not present

## 2022-10-09 DIAGNOSIS — E039 Hypothyroidism, unspecified: Secondary | ICD-10-CM | POA: Diagnosis not present

## 2022-10-09 DIAGNOSIS — Z0000 Encounter for general adult medical examination without abnormal findings: Secondary | ICD-10-CM | POA: Diagnosis not present

## 2022-10-09 DIAGNOSIS — Z91018 Allergy to other foods: Secondary | ICD-10-CM

## 2022-10-09 DIAGNOSIS — I10 Essential (primary) hypertension: Secondary | ICD-10-CM

## 2022-10-09 MED ORDER — EPINEPHRINE 0.3 MG/0.3ML IJ SOAJ: 0.3000 mg | INTRAMUSCULAR | 1 refills | Status: AC | PRN

## 2022-10-09 NOTE — Assessment & Plan Note (Signed)
Refilled epipen ?

## 2022-10-09 NOTE — Progress Notes (Signed)
Subjective:     Patient ID: Colleen Roach, female    DOB: 1976-04-18, 46 y.o.   MRN: [id number]  Chief Complaint  Patient presents with   Annual Exam    HPI Patient is in today for cpx.  Immunizations: td 2020, flu shot up to date, covid booster Diet: overall healthy Wt Readings from Last 3 Encounters:  10/09/22 205 lb (93 kg)  07/10/22 202 lb (91.6 kg)  12/05/21 199 lb 8 oz (90.5 kg)  Exercise: goes to the gym 2 x a week Colonoscopy: due Pap Smear: 10/11/2020 (negative) Mammogram: 01/09/22 Vision: up to date Dental: up to date  HTN- maintained on amlodipine 21m.  BP Readings from Last 3 Encounters:  10/09/22 137/79  07/10/22 135/86  12/05/21 139/78     Health Maintenance Due  Topic Date Due   COVID-19 Vaccine (3 - Moderna risk series) 02/25/2020   COLONOSCOPY (Pts 45-466yrInsurance coverage will need to be confirmed)  Never done    Past Medical History:  Diagnosis Date   Back pain    [facility] ([facility]   DCIS left breast   Cancer ([facility]   Fatty liver    Hypertension    Personal history of radiation therapy     Past Surgical History:  Procedure Laterality Date   BREAST LUMPECTOMY WITH RADIOACTIVE SEED LOCALIZATION Left 05/24/2021   Procedure: LEFT BREAST LUMPECTOMY WITH RADIOACTIVE SEED LOCALIZATION;  Surgeon: TsDonnie MesaMD;  [facility] Service: General;  Laterality: Left;    Family History  Problem Relation Age of Onset   Hypertension Mother        hyperglycemia   Hypertension Father        hyperglycemia   Liver cancer Father    Hypertension Sister    Anxiety disorder Sister    Allergies Daughter     Social History   Socioeconomic History   Marital status: Married    Spouse name: Not on file   Number of children: 2   Years of education: Master   Highest education level: Not on file  Occupational History   Occupation: Lab tech  Tobacco Use   Smoking status: Never   Smokeless tobacco: Never   Vaping Use   Vaping Use: Never used  Substance and Sexual Activity   Alcohol use: Never   Drug use: Never   Sexual activity: Yes    Partners: Male    Birth control/protection: I.U.D.    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[id number]   Enjoys movies, cooking   [place]12/05[place]   Overall Financial Resource Strain (CARDIA)    Difficulty of Paying Living Expenses: Not hard at all  Food Insecurity: No Food Insecurity (12/05/2021)   Hunger Vital Sign    Worried About Running Out of Food in the Last Year: Never true    [place] the Last Year: Never true  Transportation Needs: No Transportation Needs (12/05/2021)   PRAPARE - Transportation    Lack of Transportation (Medical): No    Lack of  Transportation (Non-Medical): No  Physical Activity: Inactive (12/05/2021)   Exercise Vital Sign    Days of Exercise per Week: 0 days    Minutes of Exercise per Session: 0 min  Stress: No Stress Concern Present (12/05/2021)   [place]    Feeling of Stress : Only a little  Social Connections: Moderately Integrated (12/05/2021)   Social Connection and Isolation Panel [NHANES]    Frequency of Communication with Friends and Family: More than three times a week    Frequency of Social Gatherings with Friends and Family: Three times a week    Attends Religious Services: 1 to 4 times per year    Active Member of Clubs or Organizations: No    Attends Archivist Meetings: Never    Marital Status: Married  Human resources officer Violence: Not At Risk (12/05/2021)   Humiliation, Afraid, Rape, and Kick questionnaire     Fear of Current or Ex-Partner: No    Emotionally Abused: No    Physically Abused: No    Sexually Abused: No    Outpatient Medications Prior to Visit  Medication Sig Dispense Refill   amLODipine (NORVASC) 5 MG tablet Take 1 tablet (5 mg total) by mouth daily. 90 tablet 1   loratadine (CLARITIN) 10 MG tablet Take 1 tablet (10 mg total) by mouth daily. 90 tablet 3   Multiple Vitamins-Calcium (ONE-A-DAY WOMENS FORMULA) TABS One tab by mouth once daily 90 tablet 3   EPINEPHrine (EPIPEN 2-PAK) 0.3 mg/0.3 mL IJ SOAJ injection Inject 0.3 mg into the muscle as needed for anaphylaxis. 1 each 1   Vitamin D, Ergocalciferol, (DRISDOL) 1.25 MG (50000 UNIT) CAPS capsule Take 1 capsule (50,000 Units total) by mouth every 7 (seven) days. 12 capsule 0   No facility-administered medications prior to visit.    Allergies  Allergen Reactions   Augmentin [Amoxicillin-Pot Clavulanate]     Mucous in throat and diarrhea   Fish Allergy Swelling    Facial Swelling    Ibuprofen     rash   Pollen Extract    Almond (Diagnostic) Rash   Cashew Nut Oil Rash   Eggs Or Egg-Derived Products Rash   Naproxen Sodium Rash   Peanut-Containing Drug Products Rash   Shellfish Allergy Rash    Review of Systems  Constitutional:  Negative for weight loss.  HENT:  Positive for congestion (only first thing in the am). Negative for hearing loss.   Eyes:  Negative for blurred vision.  Respiratory:  Negative for cough.   Cardiovascular:  Negative for leg swelling.  Gastrointestinal:  Negative for constipation and diarrhea.  Genitourinary:  Negative for dysuria and frequency.  Musculoskeletal:  Negative for joint pain and myalgias.  Skin:  Negative for rash.  Neurological:  Negative for headaches.  Psychiatric/Behavioral:         Denies depression/anxiety       Objective:    Physical Exam  BP 137/79   Pulse 72   Temp 98.2 F (36.8 C)   Resp 18   Ht 5' 3.5" (1.613 m)   Wt 205 lb (93 kg)   SpO2 97%   BMI 35.74  kg/m  Wt Readings from Last 3 Encounters:  10/09/22 205 lb (93 kg)  07/10/22 202 lb (91.6 kg)  12/05/21 199 lb 8 oz (90.5 kg)   Physical Exam  Constitutional: She is oriented to person, place, and time. She appears well-developed and well-nourished. No distress.  HENT:  Head: Normocephalic  and atraumatic.  Right Ear: Tympanic membrane and ear canal normal.  Left Ear: Tympanic membrane and ear canal normal.  Mouth/Throat: Oropharynx is clear and moist.  Eyes: Pupils are equal, round, and reactive to light. No scleral icterus.  Neck: Normal range of motion. No thyromegaly present.  Cardiovascular: Normal rate and regular rhythm.   No murmur heard. Pulmonary/Chest: Effort normal and breath sounds normal. No respiratory distress. He has no wheezes. She has no rales. She exhibits no tenderness.  Abdominal: Soft. Bowel sounds are normal. She exhibits no distension and no mass. There is no tenderness. There is no rebound and no guarding.  Musculoskeletal: She exhibits no edema.  Lymphadenopathy:    She has no cervical adenopathy.  Neurological: She is alert and oriented to person, place, and time. She has normal patellar reflexes. She exhibits normal muscle tone. Coordination normal.  Skin: Skin is warm and dry.  Psychiatric: She has a normal mood and affect. Her behavior is normal. Judgment and thought content normal.  Breast/pelvic: deferred          Assessment & Plan:       Assessment & Plan:   Problem List Items Addressed This Visit       Unprioritized   Vitamin D deficiency - Primary   Relevant Orders   Vitamin D (25 hydroxy)   Preventative health care    Recommended that she work on weight loss.  Pap/mammo up to date. Refer for colo.  Recommended covid booster at her pharmacy.       Relevant Orders   CBC w/Diff   Prediabetes    Will obtain A1c.       Relevant Orders   HgB A1c   Hypothyroidism   Relevant Orders   TSH   Essential hypertension    BP Readings  from Last 3 Encounters:  10/09/22 137/79  07/10/22 135/86  12/05/21 139/78  BP at goal. Continue amlodipine 2m.       Relevant Medications   EPINEPHrine (EPIPEN 2-PAK) 0.3 mg/0.3 mL IJ SOAJ injection   Other Visit Diagnoses     Hyperlipidemia, unspecified hyperlipidemia type       Relevant Medications   EPINEPHrine (EPIPEN 2-PAK) 0.3 mg/0.3 mL IJ SOAJ injection   Other Relevant Orders   Comp Met (CMET)   Lipid panel   Screening for colon cancer       Relevant Orders   Ambulatory referral to Gastroenterology       I have discontinued KArdath SaxK. [place]oth's Vitamin D (Ergocalciferol). I am also having her maintain her loratadine, One-A-Day Womens Formula, amLODipine, and EPINEPHrine.  Meds ordered this encounter  Medications   EPINEPHrine (EPIPEN 2-PAK) 0.3 mg/0.3 mL IJ SOAJ injection    Sig: Inject 0.3 mg into the muscle as needed for anaphylaxis.    Dispense:  1 each    Refill:  1    Order Specific Question:   Supervising Provider    Answer:   BPenni HomansA [[id number]

## 2022-10-09 NOTE — Assessment & Plan Note (Signed)
Recommended that she work on weight loss.  Pap/mammo up to date. Refer for colo.  Recommended covid booster at her pharmacy.

## 2022-10-09 NOTE — Assessment & Plan Note (Signed)
Will obtain A1c.

## 2022-10-09 NOTE — Assessment & Plan Note (Signed)
BP Readings from Last 3 Encounters:  10/09/22 137/79  07/10/22 135/86  12/05/21 139/78   BP at goal. Continue amlodipine '5mg'$ .

## 2022-10-10 LAB — COMPREHENSIVE METABOLIC PANEL
AG Ratio: 1.8 (calc) (ref 1.0–2.5)
ALT: 79 U/L — ABNORMAL HIGH (ref 6–29)
AST: 37 U/L — ABNORMAL HIGH (ref 10–35)
Albumin: 4.5 g/dL (ref 3.6–5.1)
Alkaline phosphatase (APISO): 55 U/L (ref 31–125)
BUN: 16 mg/dL (ref 7–25)
CO2: 28 mmol/L (ref 20–32)
Calcium: 9.2 mg/dL (ref 8.6–10.2)
Chloride: 103 mmol/L (ref 98–110)
Creat: 0.69 mg/dL (ref 0.50–0.99)
Globulin: 2.5 g/dL (calc) (ref 1.9–3.7)
Glucose, Bld: 88 mg/dL (ref 65–99)
Potassium: 4.7 mmol/L (ref 3.5–5.3)
Sodium: 138 mmol/L (ref 135–146)
Total Bilirubin: 0.6 mg/dL (ref 0.2–1.2)
Total Protein: 7 g/dL (ref 6.1–8.1)

## 2022-10-10 LAB — CBC WITH DIFFERENTIAL/PLATELET
Absolute Monocytes: 483 cells/uL (ref 200–950)
Basophils Absolute: 62 cells/uL (ref 0–200)
Basophils Relative: 0.9 %
Eosinophils Absolute: 304 cells/uL (ref 15–500)
Eosinophils Relative: 4.4 %
HCT: 42.4 % (ref 35.0–45.0)
Hemoglobin: 13.9 g/dL (ref 11.7–15.5)
Lymphs Abs: 2560 cells/uL (ref 850–3900)
MCH: 28.4 pg (ref 27.0–33.0)
MCHC: 32.8 g/dL (ref 32.0–36.0)
MCV: 86.7 fL (ref 80.0–100.0)
MPV: 10.7 fL (ref 7.5–12.5)
Monocytes Relative: 7 %
Neutro Abs: 3491 cells/uL (ref 1500–7800)
Neutrophils Relative %: 50.6 %
Platelets: 267 10*3/uL (ref 140–400)
RBC: 4.89 10*6/uL (ref 3.80–5.10)
RDW: 12.8 % (ref 11.0–15.0)
Total Lymphocyte: 37.1 %
WBC: 6.9 10*3/uL (ref 3.8–10.8)

## 2022-10-10 LAB — LIPID PANEL
Cholesterol: 212 mg/dL — ABNORMAL HIGH (ref <200)
HDL: 44 mg/dL — ABNORMAL LOW (ref >=50)
LDL Cholesterol (Calc): 142 mg/dL (calc) — ABNORMAL HIGH
Non-HDL Cholesterol (Calc): 168 mg/dL (calc) — ABNORMAL HIGH (ref <130)
Total CHOL/HDL Ratio: 4.8 (calc) (ref <5.0)
Triglycerides: 137 mg/dL (ref <150)

## 2022-10-10 LAB — TSH: TSH: 2.32 mIU/L

## 2022-10-10 LAB — VITAMIN D 25 HYDROXY (VIT D DEFICIENCY, FRACTURES): Vit D, 25-Hydroxy: 30 ng/mL (ref 30–100)

## 2022-10-10 LAB — HEMOGLOBIN A1C
Hgb A1c MFr Bld: 5.6 % of total Hgb (ref <5.7)
Mean Plasma Glucose: 114 mg/dL
eAG (mmol/L): 6.3 mmol/L

## 2022-10-16 ENCOUNTER — Other Ambulatory Visit: Payer: Self-pay | Admitting: Family

## 2022-10-16 DIAGNOSIS — E559 Vitamin D deficiency, unspecified: Secondary | ICD-10-CM

## 2022-10-17 NOTE — Telephone Encounter (Signed)
Called a few times but no answer, lvm

## 2022-10-17 NOTE — Telephone Encounter (Signed)
Please advise pt that instead of the weekly vit D I would like her to purchase OTC vitamin D and take 3000 iu once daily.

## 2022-10-19 NOTE — Telephone Encounter (Signed)
Patient advised of change, she verbalized understanding

## 2022-12-05 ENCOUNTER — Inpatient Hospital Stay: Payer: 59 | Admitting: Adult Health

## 2022-12-07 ENCOUNTER — Other Ambulatory Visit: Payer: Self-pay | Admitting: Family

## 2022-12-07 DIAGNOSIS — R928 Other abnormal and inconclusive findings on diagnostic imaging of breast: Secondary | ICD-10-CM

## 2023-01-27 ENCOUNTER — Ambulatory Visit
Admission: RE | Admit: 2023-01-27 | Discharge: 2023-01-27 | Disposition: A | Discharge status: Home / Self Care | Payer: 59 | Source: Ambulatory Visit | Attending: Family | Admitting: Family

## 2023-01-27 DIAGNOSIS — R928 Other abnormal and inconclusive findings on diagnostic imaging of breast: Secondary | ICD-10-CM

## 2023-04-12 ENCOUNTER — Other Ambulatory Visit: Payer: Self-pay | Admitting: Family

## 2023-04-16 ENCOUNTER — Ambulatory Visit: Payer: 59 | Admitting: Family

## 2023-05-07 ENCOUNTER — Other Ambulatory Visit: Payer: Self-pay | Admitting: Family

## 2023-05-07 NOTE — Telephone Encounter (Signed)
Please contact pt to schedule a follow up visit.  

## 2023-05-08 NOTE — Telephone Encounter (Signed)
Mail box full, mychart msg sent

## 2023-05-25 LAB — HM COLONOSCOPY

## 2023-06-01 ENCOUNTER — Other Ambulatory Visit: Payer: Self-pay | Admitting: Family

## 2023-06-08 ENCOUNTER — Other Ambulatory Visit: Payer: Self-pay | Admitting: Family

## 2023-09-04 ENCOUNTER — Telehealth: Payer: Self-pay | Admitting: Family

## 2023-09-04 ENCOUNTER — Ambulatory Visit: Payer: 59 | Admitting: Family

## 2023-09-04 VITALS — BP 155/79 | HR 111 | Temp 98.7°F | Resp 16 | Ht 63.5 in | Wt 208.0 lb

## 2023-09-04 DIAGNOSIS — M5432 Sciatica, left side: Secondary | ICD-10-CM | POA: Insufficient documentation

## 2023-09-04 DIAGNOSIS — I10 Essential (primary) hypertension: Secondary | ICD-10-CM

## 2023-09-04 MED ORDER — METHOCARBAMOL 500 MG PO TABS: 500.0000 mg | ORAL_TABLET | Freq: Three times a day (TID) | ORAL | 0 refills | Status: AC | PRN

## 2023-09-04 MED ORDER — METHYLPREDNISOLONE 4 MG PO TBPK: ORAL_TABLET | ORAL | 0 refills | Status: DC

## 2023-09-04 NOTE — Assessment & Plan Note (Signed)
  Elevated blood pressure (158/70) during visit, possibly due to pain. Patient currently on Amlodipine 5mg  daily. -Check blood pressure at home and report reading tomorrow. -Consider medication adjustment if home readings are consistently high.

## 2023-09-04 NOTE — Assessment & Plan Note (Signed)
  Recurrent pain due to known disc degeneration, exacerbated by physical activity. Pain is the worst in the morning due to stiffness and improves with activity. No bladder or bowel incontinence. -Start Medrol (steroid pack) for inflammation. -Start Robaxin (muscle relaxer) in the evenings for muscle spasm. -Consider physical therapy if no improvement in 2 weeks.

## 2023-09-04 NOTE — Progress Notes (Signed)
6+9/   Subjective:     Patient ID: Colleen Roach, female    DOB: 12/15/1975, 47 y.o.   MRN: [id number]  Chief Complaint  Patient presents with   Hip Pain    Complains of left hip pain going down left leg since August. Getting worse.     Hip Pain     Discussed the use of AI scribe software for clinical note transcription with the patient, who gave verbal consent to proceed.  History of Present Illness   The patient, with a history of a flat and pinched disc in the lower back, presents with a recurrence of lower back pain. They report a history of intermittent pain, with periods of remission lasting several years. The current episode began in August, following a period of increased physical activity including jumping jacks and jogging. The pain, initially mild, has progressively worsened despite over-the-counter medication. The pain originates from the left buttock down to her left lateral ankle.  She describes as a 'squeezing' sensation. The pain is most severe in the morning, making it difficult to stand in the shower, and gradually improves with activity to a level of 7 or 8 out of 10.       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   Cancer (HCC)    Fatty liver    Hypertension    Personal history of radiation therapy     Past Surgical History:  Procedure Laterality Date   BREAST LUMPECTOMY WITH RADIOACTIVE SEED LOCALIZATION Left 05/24/2021   Procedure: LEFT BREAST LUMPECTOMY WITH RADIOACTIVE SEED LOCALIZATION;  Surgeon: Manus Rudd, MD;  Location: [facility];  Service: General;  Laterality: Left;    Family History  Problem Relation Age of Onset    Hypertension Mother        hyperglycemia   Hypertension Father        hyperglycemia   Liver cancer Father    Hypertension Sister    Anxiety disorder Sister    Allergies Daughter     Social History   Socioeconomic History   Marital status: Married    Spouse name: Not on file   Number of children: 2   Years of education: Master   Highest education level: Not on file  Occupational History   Occupation: Lab tech  Tobacco Use   Smoking status: Never   Smokeless tobacco: Never  Vaping Use   Vaping status: Never Used  Substance and Sexual Activity   Alcohol use: Never   Drug use: Never   Sexual activity: Yes    Partners: Male    Birth control/protection: I.U.D.    Comment: Mirena   Other Topics Concern   Not on file  Social History Narrative   ** Merged History Encounter ** Two daughters- 2006 and [id number]   Works as a Patent examiner for [employer]   Grew up in [place]   Moved to the [place] at age 11 with her husband   Enjoys movies, cooking   Sister lives in [place]         Social Determinants of Health   Financial Resource Strain:  Low Risk  (12/05/2021)   Overall Financial Resource Strain (CARDIA)    Difficulty of Paying Living Expenses: Not hard at all  Food Insecurity: No Food Insecurity (12/05/2021)   Hunger Vital Sign    Worried About Running Out of Food in the Last Year: Never true    Ran Out of Food in the Last Year: Never true  Transportation Needs: No Transportation Needs (12/05/2021)   PRAPARE - Administrator, Civil Service (Medical): No    Lack of Transportation (Non-Medical): No  Physical Activity: Inactive (12/05/2021)   Exercise Vital Sign    Days of Exercise per Week: 0 days    Minutes of Exercise per Session: 0 min  Stress: No Stress Concern Present (12/05/2021)   [employer] of Occupational Health - Occupational Stress Questionnaire    Feeling of Stress : Only a little  Social Connections: Moderately Integrated (12/05/2021)    Social Connection and Isolation Panel [NHANES]    Frequency of Communication with Friends and Family: More than three times a week    Frequency of Social Gatherings with Friends and Family: Three times a week    Attends Religious Services: 1 to 4 times per year    Active Member of Clubs or Organizations: No    Attends Banker Meetings: Never    Marital Status: Married  Catering manager Violence: Not At Risk (12/05/2021)   Humiliation, Afraid, Rape, and Kick questionnaire    Fear of Current or Ex-Partner: No    Emotionally Abused: No    Physically Abused: No    Sexually Abused: No    Outpatient Medications Prior to Visit  Medication Sig Dispense Refill   amLODipine (NORVASC) 5 MG tablet TAKE 1 TABLET (5 MG TOTAL) BY MOUTH DAILY. 90 tablet 1   EPINEPHrine (EPIPEN 2-PAK) 0.3 mg/0.3 mL IJ SOAJ injection Inject 0.3 mg into the muscle as needed for anaphylaxis. 1 each 1   loratadine (CLARITIN) 10 MG tablet Take 1 tablet (10 mg total) by mouth daily. 90 tablet 3   Multiple Vitamins-Calcium (ONE-A-DAY WOMENS FORMULA) TABS One tab by mouth once daily 90 tablet 3   No facility-administered medications prior to visit.    Allergies  Allergen Reactions   Augmentin [Amoxicillin-Pot Clavulanate]     Mucous in throat and diarrhea   Fish Allergy Swelling    Facial Swelling    Ibuprofen     rash   Pollen Extract    Almond (Diagnostic) Rash   Cashew Nut Oil Rash   Egg-Derived Products Rash   Naproxen Sodium Rash   Peanut-Containing Drug Products Rash   Shellfish Allergy Rash    ROS    See HPI Objective:    Physical Exam Constitutional:      General: She is not in acute distress.    Appearance: Normal appearance. She is well-developed.  HENT:     Head: Normocephalic and atraumatic.     Right Ear: External ear normal.     Left Ear: External ear normal.  Eyes:     General: No scleral icterus. Neck:     Thyroid: No thyromegaly.  Cardiovascular:     Rate and Rhythm:  Normal rate and regular rhythm.     Heart sounds: Normal heart sounds. No murmur heard. Pulmonary:     Effort: Pulmonary effort is normal. No respiratory distress.     Breath sounds: Normal breath sounds. No wheezing.  Musculoskeletal:     Cervical back: Normal and neck supple. No swelling  or tenderness.     Thoracic back: Normal. No swelling or tenderness.     Lumbar back: Normal. No swelling or tenderness.  Skin:    General: Skin is warm and dry.  Neurological:     Mental Status: She is alert and oriented to person, place, and time.     Deep Tendon Reflexes:     Reflex Scores:      Patellar reflexes are 3+ on the right side and 3+ on the left side.    Comments: Bilateral LE strength is 5/5  Psychiatric:        Mood and Affect: Mood normal.        Behavior: Behavior normal.        Thought Content: Thought content normal.        Judgment: Judgment normal.      BP (!) 155/79 (BP Location: Right Arm, Patient Position: Sitting, Cuff Size: Large)   Pulse (!) 111   Temp 98.7 F (37.1 C) (Oral)   Resp 16   Ht 5' 3.5" (1.613 m)   Wt 208 lb (94.3 kg)   SpO2 99%   BMI 36.27 kg/m  Wt Readings from Last 3 Encounters:  09/04/23 208 lb (94.3 kg)  10/09/22 205 lb (93 kg)  07/10/22 202 lb (91.6 kg)       Assessment & Plan:   Problem List Items Addressed This Visit       Unprioritized   Sciatica of left side - Primary     Recurrent pain due to known disc degeneration, exacerbated by physical activity. Pain is the worst in the morning due to stiffness and improves with activity. No bladder or bowel incontinence. -Start Medrol (steroid pack) for inflammation. -Start Robaxin (muscle relaxer) in the evenings for muscle spasm. -Consider physical therapy if no improvement in 2 weeks.        Relevant Medications   methocarbamol (ROBAXIN) 500 MG tablet   Essential hypertension     Elevated blood pressure (158/70) during visit, possibly due to pain. Patient currently on Amlodipine  5mg  daily. -Check blood pressure at home and report reading tomorrow. -Consider medication adjustment if home readings are consistently high.        I am having Colleen Roach start on methylPREDNISolone and methocarbamol. I am also having her maintain her loratadine, One-A-Day Womens Formula, EPINEPHrine, and amLODipine.  Meds ordered this encounter  Medications   methylPREDNISolone (MEDROL DOSEPAK) 4 MG TBPK tablet    Sig: Please take per package instructions    Dispense:  21 tablet    Refill:  0    Order Specific Question:   Supervising Provider    Answer:   Danise Edge A [[id number]]   methocarbamol (ROBAXIN) 500 MG tablet    Sig: Take 1 tablet (500 mg total) by mouth every 8 (eight) hours as needed.    Dispense:  20 tablet    Refill:  0    Order Specific Question:   Supervising Provider    Answer:   Danise Edge A [[id number]]

## 2023-09-04 NOTE — Telephone Encounter (Signed)
Please contact pt to schedule a follow up visit with me in 1 month.

## 2023-09-05 ENCOUNTER — Encounter: Payer: Self-pay | Admitting: Family

## 2023-09-05 NOTE — Telephone Encounter (Signed)
Lvm2 sched  

## 2023-09-06 NOTE — Telephone Encounter (Signed)
Pt called on another matter but wanted to check the status of this. Routed HP due to medication reaction

## 2023-09-07 ENCOUNTER — Encounter: Payer: Self-pay | Admitting: Medical

## 2023-09-07 ENCOUNTER — Ambulatory Visit: Payer: 59 | Admitting: Medical

## 2023-09-07 ENCOUNTER — Other Ambulatory Visit (HOSPITAL_BASED_OUTPATIENT_CLINIC_OR_DEPARTMENT_OTHER): Payer: Self-pay

## 2023-09-07 VITALS — BP 145/85 | HR 100 | Temp 98.2°F | Resp 18 | Ht 63.5 in | Wt 208.6 lb

## 2023-09-07 DIAGNOSIS — L299 Pruritus, unspecified: Secondary | ICD-10-CM

## 2023-09-07 DIAGNOSIS — M5432 Sciatica, left side: Secondary | ICD-10-CM | POA: Diagnosis not present

## 2023-09-07 DIAGNOSIS — B029 Zoster without complications: Secondary | ICD-10-CM | POA: Diagnosis not present

## 2023-09-07 DIAGNOSIS — R21 Rash and other nonspecific skin eruption: Secondary | ICD-10-CM | POA: Diagnosis not present

## 2023-09-07 DIAGNOSIS — I10 Essential (primary) hypertension: Secondary | ICD-10-CM

## 2023-09-07 MED ORDER — METHYLPREDNISOLONE 4 MG PO TBPK
ORAL_TABLET | ORAL | 0 refills | Status: DC
  Filled 2023-09-07: qty 6, 3d supply, fill #0

## 2023-09-07 MED ORDER — FAMCICLOVIR 500 MG PO TABS
500.0000 mg | ORAL_TABLET | Freq: Three times a day (TID) | ORAL | 0 refills | Status: DC
  Filled 2023-09-07: qty 21, 7d supply, fill #0

## 2023-09-07 NOTE — Patient Instructions (Addendum)
Sciatica Left-sided sciatica improved from severe (8-9/10) to moderate (4-5/10) with Medrol. Patient reported inconsistent dosing and accelerated use of Medrol. No stiffness reported, so Robaxin was not used. -Continue Medrol, with additional 3-day course prescribed (12mg  tomorrow, 8mg  the following day, 4mg  on the third day). -Update provider on pain level on Monday.  Rt upper chest more chest bumps linear pattern more like shingles. not convinced allergic reaction. Possible but doubtful allergic reaction to Medrol, continue loratadine. -Start Famvir 500mg  three times a day for 7 days due to possible shingles. -Continue loratadine for itching as needed. -Stop Medrol if rashes appear in different areas and notify me if rash worsens. If severe rash  or ssociated signs/symptom then be seen in ED.  Hypertension Elevated blood pressure noted, currently on amlodipine 5mg . Patient has been asked to monitor blood pressure but has not been consistent in reporting results. -Check blood pressure daily for the next four days and report readings to provider.   Follow up my chart update on monday afternoon  Back Exercises These exercises help to make your trunk and back strong. They also help to keep the lower back flexible. Doing these exercises can help to prevent or lessen pain in your lower back. If you have back pain, try to do these exercises 2-3 times each day or as told by your doctor. As you get better, do the exercises once each day. Repeat the exercises more often as told by your doctor. To stop back pain from coming back, do the exercises once each day, or as told by your doctor. Do exercises exactly as told by your doctor. Stop right away if you feel sudden pain or your pain gets worse. Exercises Single knee to chest Do these steps 3-5 times in a row for each leg: Lie on your back on a firm bed or the floor with your legs stretched out. Bring one knee to your chest. Grab your knee or  thigh with both hands and hold it in place. Pull on your knee until you feel a gentle stretch in your lower back or butt. Keep doing the stretch for 10-30 seconds. Slowly let go of your leg and straighten it. Pelvic tilt Do these steps 5-10 times in a row: Lie on your back on a firm bed or the floor with your legs stretched out. Bend your knees so they point up to the ceiling. Your feet should be flat on the floor. Tighten your lower belly (abdomen) muscles to press your lower back against the floor. This will make your tailbone point up to the ceiling instead of pointing down to your feet or the floor. Stay in this position for 5-10 seconds while you gently tighten your muscles and breathe evenly. Cat-cow Do these steps until your lower back bends more easily: Get on your hands and knees on a firm bed or the floor. Keep your hands under your shoulders, and keep your knees under your hips. You may put padding under your knees. Let your head hang down toward your chest. Tighten (contract) the muscles in your belly. Point your tailbone toward the floor so your lower back becomes rounded like the back of a cat. Stay in this position for 5 seconds. Slowly lift your head. Let the muscles of your belly relax. Point your tailbone up toward the ceiling so your back forms a sagging arch like the back of a cow. Stay in this position for 5 seconds.  Press-ups Do these steps 5-10 times in a  row: Lie on your belly (face-down) on a firm bed or the floor. Place your hands near your head, about shoulder-width apart. While you keep your back relaxed and keep your hips on the floor, slowly straighten your arms to raise the top half of your body and lift your shoulders. Do not use your back muscles. You may change where you place your hands to make yourself more comfortable. Stay in this position for 5 seconds. Keep your back relaxed. Slowly return to lying flat on the floor.  Bridges Do these steps 10 times  in a row: Lie on your back on a firm bed or the floor. Bend your knees so they point up to the ceiling. Your feet should be flat on the floor. Your arms should be flat at your sides, next to your body. Tighten your butt muscles and lift your butt off the floor until your waist is almost as high as your knees. If you do not feel the muscles working in your butt and the back of your thighs, slide your feet 1-2 inches (2.5-5 cm) farther away from your butt. Stay in this position for 3-5 seconds. Slowly lower your butt to the floor, and let your butt muscles relax. If this exercise is too easy, try doing it with your arms crossed over your chest. Belly crunches Do these steps 5-10 times in a row: Lie on your back on a firm bed or the floor with your legs stretched out. Bend your knees so they point up to the ceiling. Your feet should be flat on the floor. Cross your arms over your chest. Tip your chin a little bit toward your chest, but do not bend your neck. Tighten your belly muscles and slowly raise your chest just enough to lift your shoulder blades a tiny bit off the floor. Avoid raising your body higher than that because it can put too much stress on your lower back. Slowly lower your chest and your head to the floor. Back lifts Do these steps 5-10 times in a row: Lie on your belly (face-down) with your arms at your sides, and rest your forehead on the floor. Tighten the muscles in your legs and your butt. Slowly lift your chest off the floor while you keep your hips on the floor. Keep the back of your head in line with the curve in your back. Look at the floor while you do this. Stay in this position for 3-5 seconds. Slowly lower your chest and your face to the floor. Contact a doctor if: Your back pain gets a lot worse when you do an exercise. Your back pain does not get better within 2 hours after you exercise. If you have any of these problems, stop doing the exercises. Do not do them  again unless your doctor says it is okay. Get help right away if: You have sudden, very bad back pain. If this happens, stop doing the exercises. Do not do them again unless your doctor says it is okay. This information is not intended to replace advice given to you by your health care provider. Make sure you discuss any questions you have with your health care provider. Document Revised: 12/29/2020 Document Reviewed: 12/29/2020 Elsevier Patient Education  2024 [employer].

## 2023-09-07 NOTE — Progress Notes (Signed)
Subjective:    Patient ID: Colleen Roach, female    DOB: [birth year], 47 y.o.   MRN: [id number]  HPI  Discussed the use of AI scribe software for clinical note transcription with the patient, who gave verbal consent to proceed.  History of Present Illness   The patient, with a history of hypertension and sciatica, presents with a recent exacerbation of left-sided sciatica. She was prescribed a Medrol dose pack and Robaxin muscle relaxant three days prior. The patient reports initial improvement in pain, which was severe and made walking difficult, during the first two days of the Medrol dose pack. However, on the third day, the patient noticed a return of pain as the dosage decreased. The current pain level is reported as 4-5, improved from an initial 8-9. The patient has not taken the Robaxin as she has not experienced any stiffness.  Concurrently, the patient developed a rash on the right upper chest on the first day of the Medrol dose pack. The rash was accompanied by facial flushing and itching in the forearms, which resolved with loratadine. The rash on the chest, however, persisted and was itchy, leading to scratching. The patient has no known allergies to steroids and has previously received steroid injections without issue. no tongue swelling, no sob, no wheezing and no obvious rash on exam except on chest.  The patient's hypertension is currently managed with amlodipine 5mg , but recent readings have been elevated. The patient was advised to monitor her blood pressure daily for a week and report the readings, but has not yet done so.       Review of Systems  Constitutional:  Negative for chills and fever.  HENT:  Negative for congestion.   Respiratory:  Negative for cough, chest tightness and wheezing.   Cardiovascular:  Negative for chest pain and palpitations.  Gastrointestinal:  Negative for abdominal pain.  Musculoskeletal:  Positive for back pain.  Skin:  Positive for rash.   Neurological:  Negative for dizziness and light-headedness.  Hematological:  Negative for adenopathy. Does not bruise/bleed easily.  Psychiatric/Behavioral:  Negative for behavioral problems and decreased concentration.    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    Comment: Mirena   Other Topics Concern   Not on file  Social History Narrative   ** Merged History Encounter ** Two daughters- 2006 and [id number]   Works as a Patent examiner for [employer]   Grew up in [place]   Moved to the [place] at age 73 with her husband   Enjoys movies, cooking   Sister lives in [place]         Social Determinants of Health  Financial Resource Strain: Low Risk  (12/05/2021)   Overall Financial Resource Strain (CARDIA)    Difficulty of Paying Living Expenses: Not hard at all  Food Insecurity: No Food Insecurity (12/05/2021)   Hunger Vital Sign    Worried About Running Out of Food in the Last Year: Never true    Ran Out of Food in the Last Year: Never true  Transportation Needs: No Transportation Needs (12/05/2021)   PRAPARE - Administrator, Civil Service (Medical): No    Lack of Transportation (Non-Medical): No  Physical Activity: Inactive (12/05/2021)   Exercise Vital Sign    Days of Exercise per Week: 0 days    Minutes of Exercise  per Session: 0 min  Stress: No Stress Concern Present (12/05/2021)   [employer] of Occupational Health - Occupational Stress Questionnaire    Feeling of Stress : Only a little  Social Connections: Moderately Integrated (12/05/2021)   Social Connection and Isolation Panel [NHANES]    Frequency of Communication with Friends and Family: More than three times a week    Frequency of Social Gatherings with Friends and Family: Three times a week    Attends Religious Services: 1 to 4 times per year    Active Member of Clubs or Organizations: No    Attends Banker Meetings: Never    Marital Status: Married  Catering manager Violence: Not At Risk (12/05/2021)   Humiliation, Afraid, Rape, and Kick questionnaire    Fear of Current or Ex-Partner: No    Emotionally Abused: No    Physically Abused: No    Sexually Abused: No    Past Surgical History:  Procedure Laterality Date   BREAST LUMPECTOMY WITH RADIOACTIVE SEED LOCALIZATION Left 05/24/2021   Procedure: LEFT BREAST LUMPECTOMY WITH RADIOACTIVE SEED LOCALIZATION;  Surgeon: Manus Rudd, MD;  [facility]:  [facility];  Service: General;  Laterality: Left;    Family History  Problem Relation Age of Onset   Hypertension Mother        hyperglycemia   Hypertension Father        hyperglycemia   Liver cancer Father    Hypertension Sister    Anxiety disorder Sister    Allergies Daughter     Allergies  Allergen Reactions   Augmentin [Amoxicillin-Pot Clavulanate]     Mucous in throat and diarrhea   Fish Allergy Swelling    Facial Swelling    Ibuprofen     rash   Pollen Extract    Almond (Diagnostic) Rash   Cashew Nut Oil Rash   Egg-Derived Products Rash   Naproxen Sodium Rash   Peanut-Containing Drug Products Rash   Prednisone Rash    Itching eyes and rash   Shellfish Allergy Rash    Current Outpatient Medications on File Prior to Visit  Medication Sig Dispense Refill   amLODipine (NORVASC) 5 MG tablet  TAKE 1 TABLET (5 MG TOTAL) BY MOUTH DAILY. 90 tablet 1   EPINEPHrine (EPIPEN 2-PAK) 0.3 mg/0.3 mL IJ SOAJ injection Inject 0.3 mg into the muscle as needed for anaphylaxis. 1 each 1   loratadine (CLARITIN) 10 MG tablet Take 1 tablet (10 mg total) by mouth daily. 90 tablet 3   methocarbamol (ROBAXIN) 500 MG tablet Take 1 tablet (500 mg total) by mouth every 8 (eight) hours as needed. 20 tablet 0   Multiple Vitamins-Calcium (ONE-A-DAY WOMENS FORMULA) TABS One tab by mouth once daily 90 tablet 3   No current facility-administered medications on file prior to  visit.    BP (!) 145/85   Pulse 100   Temp 98.2 F (36.8 C)   Resp 18   Ht 5' 3.5" (1.613 m)   Wt 208 lb 9.6 oz (94.6 kg)   SpO2 98%   BMI 36.37 kg/m        Objective:   Physical Exam  General- No acute distress. Pleasant patient. Neck- Full range of motion, no jvd Lungs- Clear, even and unlabored. Heart- regular rate and rhythm. Neurologic- CNII- XII grossly intact.   Skin- linear rt side upper chest rash with scattered linear papular vs vesicle appearance  Back- left si tenderness    Assessment & Plan:   Assessment and Plan    Sciatica Left-sided sciatica improved from severe (8-9/10) to moderate (4-5/10) with Medrol. Patient reported inconsistent dosing and accelerated use of Medrol. No stiffness reported, so Robaxin was not used. -Continue Medrol, with additional 3-day course prescribed (12mg  tomorrow, 8mg  the following day, 4mg  on the third day). -Update provider on pain level on Monday.  Rt upper chest more chest bumps linear pattern more like shingles. not convinced allergic reaction. Possible but doubtful allergic reaction to Medrol, continue loratadine. -Start Famvir 500mg  three times a day for 7 days due to possible shingles. -Continue loratadine for itching as needed. -Stop Medrol if rashes appear in different areas and notify me if rash worsens. If severe rash  or ssociated signs/symptom then be seen in  ED.  Hypertension Elevated blood pressure noted, currently on amlodipine 5mg . Patient has been asked to monitor blood pressure but has not been consistent in reporting results. -Check blood pressure daily for the next four days and report readings to provider.   Follow up my chart update on monday afternoon       [employer], PA-C

## 2023-09-07 NOTE — Telephone Encounter (Signed)
Patient scheduled to come in today to see Esperanza Richters PA

## 2023-09-07 NOTE — Telephone Encounter (Signed)
Patient called back and stated that she thinks she may need a lower dose. She also wasn't sure if Colleen Roach thinks she should come back fr another appt. Please call and advise pt.

## 2023-09-10 ENCOUNTER — Encounter: Payer: Self-pay | Admitting: Medical

## 2023-09-10 ENCOUNTER — Encounter: Payer: Self-pay | Admitting: Family

## 2023-09-10 ENCOUNTER — Ambulatory Visit: Payer: 59 | Admitting: Family Medicine

## 2023-09-10 MED ORDER — METHYLPREDNISOLONE 4 MG PO TABS: ORAL_TABLET | ORAL | 0 refills | Status: DC

## 2023-09-18 ENCOUNTER — Encounter: Payer: Self-pay | Admitting: Family

## 2023-10-02 ENCOUNTER — Ambulatory Visit: Payer: 59 | Admitting: Family

## 2023-10-05 ENCOUNTER — Ambulatory Visit: Payer: 59 | Admitting: Family

## 2023-10-05 VITALS — BP 138/96 | HR 79 | Temp 98.7°F | Resp 16 | Ht 63.5 in | Wt 212.0 lb

## 2023-10-05 DIAGNOSIS — M5432 Sciatica, left side: Secondary | ICD-10-CM | POA: Diagnosis not present

## 2023-10-05 DIAGNOSIS — Z30431 Encounter for routine checking of intrauterine contraceptive device: Secondary | ICD-10-CM | POA: Diagnosis not present

## 2023-10-05 DIAGNOSIS — I10 Essential (primary) hypertension: Secondary | ICD-10-CM

## 2023-10-05 MED ORDER — AMLODIPINE BESYLATE 10 MG PO TABS: 10.0000 mg | ORAL_TABLET | Freq: Every day | ORAL | 1 refills | Status: DC

## 2023-10-05 MED ORDER — LISINOPRIL 10 MG PO TABS
10.0000 mg | ORAL_TABLET | Freq: Every day | ORAL | 1 refills | Status: DC
Start: 2023-10-05 — End: 2023-12-25

## 2023-10-05 NOTE — Assessment & Plan Note (Signed)
  Improved with Aleve or ibuprofen and an otc homeopathic supplement. Noted stomach discomfort with ibuprofen use. No constipation. -Switch to Tylenol as needed for pain control.Reserve NSAIDS for severe pain only.

## 2023-10-05 NOTE — Assessment & Plan Note (Signed)
  Blood pressure readings consistently above goal despite taking Amlodipine 10mg  daily. -Add Lisinopril 10 mg once daily. -Recheck blood pressure and labs in 2 weeks.

## 2023-10-05 NOTE — Patient Instructions (Signed)
VISIT SUMMARY:  During today's visit, we discussed your ongoing back pain and hypertension. We also addressed the need for an intrauterine device (IUD) replacement and reviewed general health maintenance recommendations.  YOUR PLAN:  -BACK PAIN: Back pain is discomfort in the lower back area. Your back pain has improved with Aleve or Motrin and an over-the-counter supplement, but you have experienced stomach discomfort with ibuprofen. We recommend switching to Tylenol as needed for pain control.  -HYPERTENSION: Hypertension is high blood pressure. Your blood pressure readings have been consistently above the target despite taking Amlodipine. We are adding Lisinopril once daily to your regimen. Please recheck your blood pressure and labs in 2 weeks.  -INTRAUTERINE DEVICE (IUD): An intrauterine device (IUD) is a form of long-term birth control. Your IUD is overdue for replacement. We will refer you to gynecology for the replacement.  -GENERAL HEALTH MAINTENANCE: For your general health, consider receiving a flu shot at your local pharmacy. We will schedule a follow-up appointment in 2 weeks.  INSTRUCTIONS:  Please recheck your blood pressure and labs in 2 weeks. Additionally, follow up with gynecology for your IUD replacement and consider getting a flu shot at your local pharmacy. We will see you again in 2 weeks for a follow-up appointment.

## 2023-10-05 NOTE — Progress Notes (Signed)
Subjective:     Patient ID: Colleen Roach, female    DOB: 1976-10-14, 47 y.o.   MRN: [id number]  Chief Complaint  Patient presents with   Back Pain    Complains of low back pain radiating down left leg   Hypertension    Here for follow up, "blood pressure going up, taking #2 5 mg amlodipine but bp still elevated"    Back Pain  Hypertension    Discussed the use of AI scribe software for clinical note transcription with the patient, who gave verbal consent to proceed.  History of Present Illness   The patient, with a history of hypertension and back pain, reports that back pain has improved to a manageable level. She has been self-medicating with Aleve and ibuprofen as well as a homeopathic supplement from [employer], which she believes may be helping. However, she has noticed abdominal discomfort when taking ibuprofen, which she has been using for its anti-inflammatory properties. She denies constipation.  The patient's hypertension has been poorly controlled despite her increasing amlodipine from 5mg  to 10mg  daily. She has been checking bp readings daily at home with readings consistently above 140/90.        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   Cancer (HCC)    Fatty liver    Hypertension    Personal history of radiation therapy     Past Surgical History:  Procedure Laterality Date   BREAST LUMPECTOMY WITH RADIOACTIVE SEED LOCALIZATION Left 05/24/2021   Procedure: LEFT BREAST LUMPECTOMY WITH RADIOACTIVE SEED LOCALIZATION;  Surgeon: Manus Rudd, MD;  Location: [facility];  Service: General;  Laterality: Left;    Family History  Problem Relation Age of Onset   Hypertension Mother        hyperglycemia   Hypertension Father        hyperglycemia    Liver cancer Father    Hypertension Sister    Anxiety disorder Sister    Allergies Daughter     Social History   Socioeconomic History   Marital status: Married    Spouse name: Not on file   Number of children: 2   Years of education: Master   Highest education level: Not on file  Occupational History   Occupation: Lab tech  Tobacco Use   Smoking status: Never   Smokeless tobacco: Never  Vaping Use   Vaping status: Never Used  Substance and Sexual Activity   Alcohol use: Never   Drug use: Never   Sexual activity: Yes    Partners: Male    Birth control/protection: I.U.D.    Comment: Mirena   Other Topics Concern   Not on file  Social History Narrative   ** Merged History Encounter ** Two daughters- 2006 and [id number]   Works as a Patent examiner for [employer]   Grew up in [place]   Moved to the [place] at age 34 with her husband   Enjoys movies, cooking   Sister lives in [place]         Social Determinants of Health   Financial Resource Strain: Low Risk  (12/05/2021)   Overall Financial Resource Strain (CARDIA)    Difficulty of Paying Living Expenses: Not hard at all  Food Insecurity:  No Food Insecurity (12/05/2021)   Hunger Vital Sign    Worried About Running Out of Food in the Last Year: Never true    Ran Out of Food in the Last Year: Never true  Transportation Needs: No Transportation Needs (12/05/2021)   PRAPARE - Administrator, Civil Service (Medical): No    Lack of Transportation (Non-Medical): No  Physical Activity: Inactive (12/05/2021)   Exercise Vital Sign    Days of Exercise per Week: 0 days    Minutes of Exercise per Session: 0 min  Stress: No Stress Concern Present (12/05/2021)   [employer] of Occupational Health - Occupational Stress Questionnaire    Feeling of Stress : Only a little  Social Connections: Moderately Integrated (12/05/2021)   Social Connection and Isolation Panel [NHANES]    Frequency of Communication with Friends  and Family: More than three times a week    Frequency of Social Gatherings with Friends and Family: Three times a week    Attends Religious Services: 1 to 4 times per year    Active Member of Clubs or Organizations: No    Attends Banker Meetings: Never    Marital Status: Married  Catering manager Violence: Not At Risk (12/05/2021)   Humiliation, Afraid, Rape, and Kick questionnaire    Fear of Current or Ex-Partner: No    Emotionally Abused: No    Physically Abused: No    Sexually Abused: No    Outpatient Medications Prior to Visit  Medication Sig Dispense Refill   EPINEPHrine (EPIPEN 2-PAK) 0.3 mg/0.3 mL IJ SOAJ injection Inject 0.3 mg into the muscle as needed for anaphylaxis. 1 each 1   loratadine (CLARITIN) 10 MG tablet Take 1 tablet (10 mg total) by mouth daily. 90 tablet 3   Multiple Vitamins-Calcium (ONE-A-DAY WOMENS FORMULA) TABS One tab by mouth once daily 90 tablet 3   amLODipine (NORVASC) 5 MG tablet TAKE 1 TABLET (5 MG TOTAL) BY MOUTH DAILY. 90 tablet 1   methocarbamol (ROBAXIN) 500 MG tablet Take 1 tablet (500 mg total) by mouth every 8 (eight) hours as needed. (Patient not taking: Reported on 10/05/2023) 20 tablet 0   famciclovir (FAMVIR) 500 MG tablet Take 1 tablet (500 mg total) by mouth 3 (three) times daily. 21 tablet 0   methylPREDNISolone (MEDROL) 4 MG tablet Take 1 tablet by mouth once daily for 3 days 3 tablet 0   No facility-administered medications prior to visit.    Allergies  Allergen Reactions   Augmentin [Amoxicillin-Pot Clavulanate]     Mucous in throat and diarrhea   Fish Allergy Swelling    Facial Swelling    Ibuprofen     rash   Pollen Extract    Almond (Diagnostic) Rash   Cashew Nut Oil Rash   Egg-Derived Products Rash   Naproxen Sodium Rash   Peanut-Containing Drug Products Rash   Prednisone Rash    Itching eyes and rash   Shellfish Allergy Rash    Review of Systems  Musculoskeletal:  Positive for back pain.   See HPI     Objective:    Physical Exam Constitutional:      General: She is not in acute distress.    Appearance: Normal appearance. She is well-developed.  HENT:     Head: Normocephalic and atraumatic.     Right Ear: External ear normal.     Left Ear: External ear normal.  Eyes:     General: No scleral icterus. Neck:  Thyroid: No thyromegaly.  Cardiovascular:     Rate and Rhythm: Normal rate and regular rhythm.     Heart sounds: Normal heart sounds. No murmur heard. Pulmonary:     Effort: Pulmonary effort is normal. No respiratory distress.     Breath sounds: Normal breath sounds. No wheezing.  Musculoskeletal:     Cervical back: Neck supple.  Skin:    General: Skin is warm and dry.  Neurological:     Mental Status: She is alert and oriented to person, place, and time.  Psychiatric:        Mood and Affect: Mood normal.        Behavior: Behavior normal.        Thought Content: Thought content normal.        Judgment: Judgment normal.      BP (!) 138/96   Pulse 79   Temp 98.7 F (37.1 C) (Oral)   Resp 16   Ht 5' 3.5" (1.613 m)   Wt 212 lb (96.2 kg)   SpO2 98%   BMI 36.97 kg/m  Wt Readings from Last 3 Encounters:  10/05/23 212 lb (96.2 kg)  09/07/23 208 lb 9.6 oz (94.6 kg)  09/04/23 208 lb (94.3 kg)       Assessment & Plan:   Problem List Items Addressed This Visit       Unprioritized   Sciatica of left side     Improved with Aleve or ibuprofen and an otc homeopathic supplement. Noted stomach discomfort with ibuprofen use. No constipation. -Switch to Tylenol as needed for pain control.Reserve NSAIDS for severe pain only.       Primary hypertension - Primary     Blood pressure readings consistently above goal despite taking Amlodipine 10mg  daily. -Add Lisinopril 10 mg once daily. -Recheck blood pressure and labs in 2 weeks.      Relevant Medications   lisinopril (ZESTRIL) 10 MG tablet   amLODipine (NORVASC) 10 MG tablet   Contraceptive management    Relevant Orders   Ambulatory referral to Obstetrics / Gynecology    I have discontinued Christiona K. Clouse's amLODipine, famciclovir, and methylPREDNISolone. I am also having her start on lisinopril and amLODipine. Additionally, I am having her maintain her loratadine, One-A-Day Womens Formula, EPINEPHrine, and methocarbamol.  Meds ordered this encounter  Medications   lisinopril (ZESTRIL) 10 MG tablet    Sig: Take 1 tablet (10 mg total) by mouth daily.    Dispense:  90 tablet    Refill:  1    Order Specific Question:   Supervising Provider    Answer:   Danise Edge A [[id number]]   amLODipine (NORVASC) 10 MG tablet    Sig: Take 1 tablet (10 mg total) by mouth daily.    Dispense:  90 tablet    Refill:  1    Order Specific Question:   Supervising Provider    Answer:   Danise Edge A [[id number]]

## 2023-10-19 ENCOUNTER — Ambulatory Visit: Payer: 59 | Admitting: Family

## 2023-10-22 ENCOUNTER — Ambulatory Visit (INDEPENDENT_AMBULATORY_CARE_PROVIDER_SITE_OTHER): Payer: 59 | Admitting: Family

## 2023-10-22 VITALS — BP 151/82 | HR 96 | Temp 98.8°F | Resp 16 | Ht 63.5 in | Wt 213.0 lb

## 2023-10-22 DIAGNOSIS — I10 Essential (primary) hypertension: Secondary | ICD-10-CM | POA: Diagnosis not present

## 2023-10-22 DIAGNOSIS — M5432 Sciatica, left side: Secondary | ICD-10-CM

## 2023-10-22 NOTE — Assessment & Plan Note (Signed)
Stable.  Monitor.  

## 2023-10-22 NOTE — Assessment & Plan Note (Signed)
Improved with addition of lisinopril 10mg . Continue same.

## 2023-10-22 NOTE — Progress Notes (Signed)
Subjective:     Patient ID: Colleen Roach, female    DOB: 02-13-1976, 47 y.o.   MRN: [id number]  Chief Complaint  Patient presents with   Hypertension    Here for follow up    Hypertension    Discussed the use of AI scribe software for clinical note transcription with the patient, who gave verbal consent to proceed.  History of Present Illness          Patient presents today for follow up.  Last visit we added lisinopril 10mg  once daily to her 10mg  of amlodipine for blood pressure. She reports tolerating lisinopril well. She does have a bp monitor at home but has not checked her blood pressure since last visit.  She notes overall her sciatica pain has been well controlled.      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   Cancer (HCC)    Fatty liver    Hypertension    Personal history of radiation therapy     Past Surgical History:  Procedure Laterality Date   BREAST LUMPECTOMY WITH RADIOACTIVE SEED LOCALIZATION Left 05/24/2021   Procedure: LEFT BREAST LUMPECTOMY WITH RADIOACTIVE SEED LOCALIZATION;  Surgeon: Manus Rudd, MD;  Location: [facility];  Service: General;  Laterality: Left;    Family History  Problem Relation Age of Onset   Hypertension Mother        hyperglycemia   Hypertension Father        hyperglycemia   Liver cancer Father    Hypertension Sister    Anxiety disorder Sister    Allergies Daughter     Social History   Socioeconomic History   Marital status: Married    Spouse name: Not on file   Number of children: 2   Years of education: Master   Highest education level: Not on file  Occupational History   Occupation: Lab tech  Tobacco Use   Smoking status: Never   Smokeless tobacco: Never  Vaping Use   Vaping status:  Never Used  Substance and Sexual Activity   Alcohol use: Never   Drug use: Never   Sexual activity: Yes    Partners: Male    Birth control/protection: I.U.D.    Comment: Mirena   Other Topics Concern   Not on file  Social History Narrative   ** Merged History Encounter ** Two daughters- 2006 and [id number]   Works as a Patent examiner for [employer]   Grew up in [place]   Moved to the [place] at age 9 with her husband   Enjoys movies, cooking   Sister lives in [place]         Social Drivers of Health   Financial Resource Strain: Low Risk  (12/05/2021)   Overall Financial Resource Strain (CARDIA)    Difficulty of Paying Living Expenses: Not hard at all  Food Insecurity: No Food Insecurity (12/05/2021)   Hunger Vital Sign    Worried About Running Out of Food in the Last Year: Never true    Ran Out of Food in the Last Year: Never true  Transportation Needs: No Transportation Needs (12/05/2021)   PRAPARE - Administrator, Civil Service (Medical): No    Lack of Transportation (Non-Medical):  No  Physical Activity: Inactive (12/05/2021)   Exercise Vital Sign    Days of Exercise per Week: 0 days    Minutes of Exercise per Session: 0 min  Stress: No Stress Concern Present (12/05/2021)   [employer] of Occupational Health - Occupational Stress Questionnaire    Feeling of Stress : Only a little  Social Connections: Moderately Integrated (12/05/2021)   Social Connection and Isolation Panel [NHANES]    Frequency of Communication with Friends and Family: More than three times a week    Frequency of Social Gatherings with Friends and Family: Three times a week    Attends Religious Services: 1 to 4 times per year    Active Member of Clubs or Organizations: No    Attends Banker Meetings: Never    Marital Status: Married  Catering manager Violence: Not At Risk (12/05/2021)   Humiliation, Afraid, Rape, and Kick questionnaire    Fear of Current or Ex-Partner: No     Emotionally Abused: No    Physically Abused: No    Sexually Abused: No    Outpatient Medications Prior to Visit  Medication Sig Dispense Refill   amLODipine (NORVASC) 10 MG tablet Take 1 tablet (10 mg total) by mouth daily. 90 tablet 1   EPINEPHrine (EPIPEN 2-PAK) 0.3 mg/0.3 mL IJ SOAJ injection Inject 0.3 mg into the muscle as needed for anaphylaxis. 1 each 1   lisinopril (ZESTRIL) 10 MG tablet Take 1 tablet (10 mg total) by mouth daily. 90 tablet 1   loratadine (CLARITIN) 10 MG tablet Take 1 tablet (10 mg total) by mouth daily. 90 tablet 3   methocarbamol (ROBAXIN) 500 MG tablet Take 1 tablet (500 mg total) by mouth every 8 (eight) hours as needed. 20 tablet 0   Multiple Vitamins-Calcium (ONE-A-DAY WOMENS FORMULA) TABS One tab by mouth once daily 90 tablet 3   No facility-administered medications prior to visit.    Allergies  Allergen Reactions   Augmentin [Amoxicillin-Pot Clavulanate]     Mucous in throat and diarrhea   Fish Allergy Swelling    Facial Swelling    Ibuprofen     rash   Pollen Extract    Almond (Diagnostic) Rash   Cashew Nut Oil Rash   Egg-Derived Products Rash   Naproxen Sodium Rash   Peanut-Containing Drug Products Rash   Prednisone Rash    Itching eyes and rash   Shellfish Allergy Rash    ROS    See HPI Objective:    Physical Exam Constitutional:      General: She is not in acute distress.    Appearance: Normal appearance. She is well-developed.  HENT:     Head: Normocephalic and atraumatic.     Right Ear: External ear normal.     Left Ear: External ear normal.  Eyes:     General: No scleral icterus. Neck:     Thyroid: No thyromegaly.  Cardiovascular:     Rate and Rhythm: Normal rate and regular rhythm.     Heart sounds: Normal heart sounds. No murmur heard. Pulmonary:     Effort: Pulmonary effort is normal. No respiratory distress.     Breath sounds: Normal breath sounds. No wheezing.  Musculoskeletal:     Cervical back: Neck supple.   Skin:    General: Skin is warm and dry.  Neurological:     Mental Status: She is alert and oriented to person, place, and time.  Psychiatric:        Mood and  Affect: Mood normal.        Behavior: Behavior normal.        Thought Content: Thought content normal.        Judgment: Judgment normal.      BP (!) 151/82 (BP Location: Right Arm, Patient Position: Sitting, Cuff Size: Large)   Pulse 96   Temp 98.8 F (37.1 C) (Oral)   Resp 16   Ht 5' 3.5" (1.613 m)   Wt 213 lb (96.6 kg)   SpO2 96%   BMI 37.14 kg/m  Wt Readings from Last 3 Encounters:  10/22/23 213 lb (96.6 kg)  10/05/23 212 lb (96.2 kg)  09/07/23 208 lb 9.6 oz (94.6 kg)       Assessment & Plan:   Problem List Items Addressed This Visit       Unprioritized   Sciatica of left side   Stable. Monitor.       Primary hypertension - Primary   Improved with addition of lisinopril 10mg . Continue same.          Relevant Orders   Basic Metabolic Panel (BMET)    I am having Colleen Roach maintain her loratadine, One-A-Day Womens Formula, EPINEPHrine, methocarbamol, lisinopril, and amLODipine.  No orders of the defined types were placed in this encounter.

## 2023-10-23 LAB — BASIC METABOLIC PANEL
BUN: 17 mg/dL (ref 7–25)
CO2: 24 mmol/L (ref 20–32)
Calcium: 9.1 mg/dL (ref 8.6–10.2)
Chloride: 105 mmol/L (ref 98–110)
Creat: 0.61 mg/dL (ref 0.50–0.99)
Glucose, Bld: 138 mg/dL — ABNORMAL HIGH (ref 65–99)
Potassium: 3.7 mmol/L (ref 3.5–5.3)
Sodium: 138 mmol/L (ref 135–146)

## 2023-11-27 ENCOUNTER — Telehealth: Payer: Self-pay

## 2023-11-27 NOTE — Telephone Encounter (Signed)
Called patient to schedule new patient appointment. Left voicemail with our contact information to call back and schedule.

## 2023-12-07 ENCOUNTER — Ambulatory Visit: Payer: 59 | Admitting: Family

## 2023-12-07 ENCOUNTER — Other Ambulatory Visit (HOSPITAL_BASED_OUTPATIENT_CLINIC_OR_DEPARTMENT_OTHER): Payer: Self-pay

## 2023-12-07 ENCOUNTER — Ambulatory Visit (HOSPITAL_BASED_OUTPATIENT_CLINIC_OR_DEPARTMENT_OTHER)
Admission: RE | Admit: 2023-12-07 | Discharge: 2023-12-07 | Disposition: A | Discharge status: Home / Self Care | Payer: 59 | Source: Ambulatory Visit | Attending: Family | Admitting: Family

## 2023-12-07 VITALS — BP 146/82 | HR 87 | Temp 98.5°F | Resp 16 | Ht 63.5 in | Wt 213.0 lb

## 2023-12-07 DIAGNOSIS — M5416 Radiculopathy, lumbar region: Secondary | ICD-10-CM

## 2023-12-07 DIAGNOSIS — I10 Essential (primary) hypertension: Secondary | ICD-10-CM | POA: Diagnosis not present

## 2023-12-07 DIAGNOSIS — R102 Pelvic and perineal pain unspecified side: Secondary | ICD-10-CM

## 2023-12-07 MED ORDER — METHYLPREDNISOLONE 4 MG PO TBPK
ORAL_TABLET | ORAL | 0 refills | Status: DC
  Filled 2023-12-07: qty 21, 6d supply, fill #0

## 2023-12-07 NOTE — Progress Notes (Signed)
 Subjective:     Patient ID: Colleen Roach, female    DOB: 1976-09-16, 48 y.o.   MRN: [id number]  Chief Complaint  Patient presents with   Back Pain    Complains of back pain that started 4 days ago   Abdominal Pain    Complains of pain LLQ abdomen, radiating to left inguinal area and leg    HPI  Discussed the use of AI scribe software for clinical note transcription with the patient, who gave verbal consent to proceed.  History of Present Illness   Colleen Roach is a 48 year old female who presents with back pain radiating to the leg.  She has been experiencing back pain since Tuesday, located on the left side and radiating down her leg. The pain begins with a sensation of pressure in her lower abdomen and progresses to her thigh and leg. She has a history of similar back pain, previously located in the middle of her back and spreading slightly.  She has a history of ovarian issues on her left side and experiences recurrent pressure in her lower abdomen. She does not have regular menstrual periods but noted some spotting the past weekend. She is sexually active.  She has been taking Aleve every six hours to manage the pain. In the past, she has taken prednisone but experienced eye itching and a rash, possibly related to sun exposure rather than the medication itself. Muscle relaxers were not effective previously.          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   Cancer (HCC)    Fatty liver    Hypertension    Personal history of radiation therapy     Past Surgical History:  Procedure Laterality Date   BREAST LUMPECTOMY WITH RADIOACTIVE SEED LOCALIZATION Left 05/24/2021   Procedure: LEFT BREAST LUMPECTOMY  WITH RADIOACTIVE SEED LOCALIZATION;  Surgeon: Belinda Cough, MD;  Location: [facility];  Service: General;  Laterality: Left;    Family History  Problem Relation Age of Onset   Hypertension Mother        hyperglycemia   Hypertension Father        hyperglycemia   Liver cancer Father    Hypertension Sister    Anxiety disorder Sister    Allergies Daughter     Social History   Socioeconomic History   Marital status: Married    Spouse name: Not on file   Number of children: 2   Years of education: Master   Highest education level: Not on file  Occupational History   Occupation: Lab tech  Tobacco Use   Smoking status: Never   Smokeless tobacco: Never  Vaping Use   Vaping status: Never Used  Substance and Sexual Activity   Alcohol use: Never   Drug use: Never   Sexual activity: Yes    Partners: Male    Birth control/protection: I.U.D.    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 [place]   Moved to the [place]  at age 41 with her husband   Enjoys movies, cooking   Sister lives in [place]         Social Drivers of Health   Financial Resource Strain: Low Risk  (12/05/2021)   Overall Financial Resource Strain (CARDIA)    Difficulty of Paying Living Expenses: Not hard at all  Food Insecurity: No Food Insecurity (12/05/2021)   Hunger Vital Sign    Worried About Running Out of Food in the Last Year: Never true    Ran Out of Food in the Last Year: Never true  Transportation Needs: No Transportation Needs (12/05/2021)   PRAPARE - Administrator, Civil Service (Medical): No    Lack of Transportation (Non-Medical): No  Physical Activity: Inactive (12/05/2021)   Exercise Vital Sign    Days of Exercise per Week: 0 days    Minutes of Exercise per Session: 0 min  Stress: No Stress Concern  Present (12/05/2021)   [employer] of Occupational Health - Occupational Stress Questionnaire    Feeling of Stress : Only a little  Social Connections: Moderately Integrated (12/05/2021)   Social Connection and Isolation Panel [NHANES]    Frequency of Communication with Friends and Family: More than three times a week    Frequency of Social Gatherings with Friends and Family: Three times a week    Attends Religious Services: 1 to 4 times per year    Active Member of Clubs or Organizations: No    Attends Banker Meetings: Never    Marital Status: Married  Catering Manager Violence: Not At Risk (12/05/2021)   Humiliation, Afraid, Rape, and Kick questionnaire    Fear of Current or Ex-Partner: No    Emotionally Abused: No    Physically Abused: No    Sexually Abused: No    Outpatient Medications Prior to Visit  Medication Sig Dispense Refill   amLODipine  (NORVASC ) 10 MG tablet Take 1 tablet (10 mg total) by mouth daily. 90 tablet 1   EPINEPHrine  (EPIPEN  2-PAK) 0.3 mg/0.3 mL IJ SOAJ injection Inject 0.3 mg into the muscle as needed for anaphylaxis. 1 each 1   lisinopril  (ZESTRIL ) 10 MG tablet Take 1 tablet (10 mg total) by mouth daily. 90 tablet 1   loratadine  (CLARITIN ) 10 MG tablet Take 1 tablet (10 mg total) by mouth daily. 90 tablet 3   methocarbamol  (ROBAXIN ) 500 MG tablet Take 1 tablet (500 mg total) by mouth every 8 (eight) hours as needed. 20 tablet 0   Multiple Vitamins-Calcium (ONE-A-DAY WOMENS FORMULA) TABS One tab by mouth once daily 90 tablet 3   No facility-administered medications prior to visit.    Allergies  Allergen Reactions   Augmentin [Amoxicillin-Pot Clavulanate]     Mucous in throat and diarrhea   Fish Allergy Swelling    Facial Swelling    Ibuprofen      rash   Pollen Extract    Almond (Diagnostic) Rash   Cashew Nut Oil Rash   Egg-Derived Products Rash   Naproxen Sodium Rash   Peanut-Containing Drug Products Rash   Prednisone Rash    Itching  eyes and rash   Shellfish Allergy Rash    ROS See HPI    Objective:    Physical Exam Constitutional:      General: She is not in acute distress.    Appearance: Normal appearance. She is well-developed.  HENT:     Head: Normocephalic and atraumatic.  Right Ear: External ear normal.     Left Ear: External ear normal.  Eyes:     General: No scleral icterus. Neck:     Thyroid : No thyromegaly.  Cardiovascular:     Rate and Rhythm: Normal rate and regular rhythm.     Heart sounds: Normal heart sounds. No murmur heard. Pulmonary:     Effort: Pulmonary effort is normal. No respiratory distress.     Breath sounds: Normal breath sounds. No wheezing.  Abdominal:     Comments: + LLQ tenderness noted  Musculoskeletal:     Cervical back: Neck supple.  Skin:    General: Skin is warm and dry.  Neurological:     Mental Status: She is alert and oriented to person, place, and time.  Psychiatric:        Mood and Affect: Mood normal.        Behavior: Behavior normal.        Thought Content: Thought content normal.        Judgment: Judgment normal.      BP (!) 146/82 (BP Location: Right Arm, Patient Position: Sitting, Cuff Size: Large)   Pulse 87   Temp 98.5 F (36.9 C) (Oral)   Resp 16   Ht 5' 3.5 (1.613 m)   Wt 213 lb (96.6 kg)   LMP  (LMP Unknown)   SpO2 98%   BMI 37.14 kg/m  Wt Readings from Last 3 Encounters:  12/07/23 213 lb (96.6 kg)  10/22/23 213 lb (96.6 kg)  10/05/23 212 lb (96.2 kg)       Assessment & Plan:   Problem List Items Addressed This Visit       Unprioritized   Primary hypertension   Bp elevated today.  Likely due to pain. Continue current medications for now.       Pelvic pain    New onset of lower abdominal pressure and heaviness, possibly related to known ovarian cyst. -Order pelvic ultrasound to evaluate for ovarian cyst changes. -Perform urine HCG to rule out pregnancy. -Follow-up with gynecology in April, or sooner if ultrasound is  concerning.      Relevant Orders   US  Pelvic Complete With Transvaginal (Completed)   Lumbar radiculopathy - Primary    Recurrent low back pain radiating to the left leg. Previous history of similar pain. Currently managed with Aleve. -Start Medrol  pack, monitor for potential allergic reaction. -Consider physical therapy referral once pain is controlled.      Relevant Orders   Ambulatory referral to Physical Therapy    I am having Esabella K. Dombrosky start on methylPREDNISolone . I am also having her maintain her loratadine , One-A-Day Womens Formula, EPINEPHrine , methocarbamol , lisinopril , and amLODipine .  Meds ordered this encounter  Medications   methylPREDNISolone  (MEDROL  DOSEPAK) 4 MG TBPK tablet    Sig: Take per package instructions    Dispense:  21 tablet    Refill:  0    Supervising Provider:   DOMENICA BLACKBIRD A [[id number]]

## 2023-12-08 ENCOUNTER — Telehealth: Payer: Self-pay | Admitting: Family

## 2023-12-08 DIAGNOSIS — M5416 Radiculopathy, lumbar region: Secondary | ICD-10-CM | POA: Insufficient documentation

## 2023-12-08 DIAGNOSIS — R102 Pelvic and perineal pain: Secondary | ICD-10-CM | POA: Insufficient documentation

## 2023-12-08 NOTE — Assessment & Plan Note (Signed)
  New onset of lower abdominal pressure and heaviness, possibly related to known ovarian cyst. -Order pelvic ultrasound to evaluate for ovarian cyst changes. -Perform urine HCG to rule out pregnancy. -Follow-up with gynecology in April, or sooner if ultrasound is concerning.

## 2023-12-08 NOTE — Patient Instructions (Signed)
 VISIT SUMMARY:  Today, we discussed your back pain that radiates to your leg and your lower abdominal pressure. We reviewed your history and current symptoms to develop a plan to manage your pain and investigate the cause of your abdominal discomfort.  YOUR PLAN:  -LOW BACK PAIN: Low back pain is discomfort in the lower back area, which can radiate to other parts of the body, such as the leg. We will start you on a Medrol  pack to help reduce inflammation and monitor for any allergic reactions. If your pain becomes more manageable, we may refer you to physical therapy for further treatment.  -PELVIC PAIN: Pelvic pain is discomfort in the lower abdomen area, which can be related to various conditions, including ovarian cysts. We will order a pelvic ultrasound to check for any changes in your ovarian cysts and perform a urine pregnancy test to rule out pregnancy. You should follow up with gynecology in April, or sooner if the ultrasound results are concerning.  INSTRUCTIONS:  Please start the Medrol  pack as prescribed and monitor for any signs of an allergic reaction. Schedule a pelvic ultrasound and a urine pregnancy test as soon as possible. Follow up with gynecology in April, or sooner if the ultrasound shows any concerning findings.

## 2023-12-08 NOTE — Assessment & Plan Note (Addendum)
  Recurrent low back pain radiating to the left leg. Previous history of similar pain. Currently managed with Aleve. -Start Medrol  pack, monitor for potential allergic reaction. -Start physical therapy referral once pain is controlled.

## 2023-12-08 NOTE — Telephone Encounter (Signed)
 Please advise pt that her ultrasound is not showing the left ovary very well.  I would like to refer her to GYN for further evaluation and possibly further imaging.

## 2023-12-08 NOTE — Assessment & Plan Note (Signed)
 Bp elevated today.  Likely due to pain. Continue current medications for now.

## 2023-12-10 NOTE — Telephone Encounter (Signed)
Called twice but no answer.

## 2023-12-11 NOTE — Telephone Encounter (Signed)
Called patient but no answer, left voice mail for patinen to call back.

## 2023-12-11 NOTE — Telephone Encounter (Signed)
Patient called Colleen Roach back and notified of this. She was already contacted by gyn and has appointment in April

## 2023-12-14 ENCOUNTER — Other Ambulatory Visit: Payer: Self-pay | Admitting: Family

## 2023-12-14 DIAGNOSIS — Z1231 Encounter for screening mammogram for malignant neoplasm of breast: Secondary | ICD-10-CM

## 2023-12-17 ENCOUNTER — Encounter: Payer: Self-pay | Admitting: Family

## 2023-12-17 DIAGNOSIS — N9489 Other specified conditions associated with female genital organs and menstrual cycle: Secondary | ICD-10-CM

## 2023-12-19 MED ORDER — TRAMADOL HCL 50 MG PO TABS
50.0000 mg | ORAL_TABLET | Freq: Four times a day (QID) | ORAL | 0 refills | Status: AC | PRN
Start: 2023-12-19 — End: 2023-12-24

## 2023-12-19 NOTE — Addendum Note (Signed)
 Addended by: Sandford Craze on: 12/19/2023 05:37 PM   Modules accepted: Orders

## 2023-12-24 ENCOUNTER — Other Ambulatory Visit: Payer: Self-pay | Admitting: Family

## 2023-12-24 NOTE — Telephone Encounter (Signed)
 Pt has appointment scheduled for 12/25/23.

## 2023-12-25 ENCOUNTER — Ambulatory Visit: Payer: 59 | Admitting: Family

## 2023-12-25 VITALS — BP 150/75 | HR 80 | Temp 97.8°F | Resp 16 | Ht 63.5 in | Wt 210.0 lb

## 2023-12-25 DIAGNOSIS — I10 Essential (primary) hypertension: Secondary | ICD-10-CM

## 2023-12-25 DIAGNOSIS — R102 Pelvic and perineal pain: Secondary | ICD-10-CM

## 2023-12-25 DIAGNOSIS — M5416 Radiculopathy, lumbar region: Secondary | ICD-10-CM | POA: Diagnosis not present

## 2023-12-25 MED ORDER — TRAMADOL HCL 50 MG PO TABS: 50.0000 mg | ORAL_TABLET | Freq: Four times a day (QID) | ORAL | 0 refills | Status: AC | PRN

## 2023-12-25 MED ORDER — LISINOPRIL 20 MG PO TABS: 20.0000 mg | ORAL_TABLET | Freq: Every day | ORAL | 1 refills | Status: DC

## 2023-12-25 NOTE — Progress Notes (Addendum)
 Subjective:     Patient ID: Colleen Roach, female    DOB: [birth year], 48 y.o.   MRN: [id number]  Chief Complaint  Patient presents with   Lumbar radiculopathy    Here for follow up, discuss pain management    HPI  Discussed the use of AI scribe software for clinical note transcription with the patient, who gave verbal consent to proceed.  History of Present Illness   DAVISHA Roach is a 48 year old female who presents with pelvic pain.  She experiences pelvic pain primarily localized to the left side where her ovary is located. The pain is described as dull and persistent, noticeable but not severe, and does not radiate along her spine. The pain worsens with Aleve but is partially relieved by tramadol, reducing the pain by about fifty percent. An MRI is scheduled to further investigate the cause of her pain.  She has a history of back pain for which she received an injection in 2006 or 2007, providing significant relief at the time. Over the years, the nature of her pain has changed, previously concentrated in the middle of her spine, now more localized to the pelvic area. No current pain along her spine is reported.  She is currently taking tramadol as needed for pain management, prescribed every six hours. She also takes Aleve, which causes a mild rash, and has experienced hives and facial swelling with Tylenol in the past. She is on lisinopril for hypertension, which has been increased to 20 mg due to consistently high blood pressure readings. Her blood pressure has been elevated on three consecutive visits, likely exacerbated by pain.  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   Cancer (HCC)    Fatty liver    Hypertension    Personal history of radiation therapy     Past Surgical History:  Procedure Laterality Date   BREAST LUMPECTOMY WITH RADIOACTIVE SEED LOCALIZATION Left 05/24/2021   Procedure: LEFT BREAST LUMPECTOMY WITH RADIOACTIVE SEED LOCALIZATION;  Surgeon: Manus Rudd, MD;  Location: [facility];  Service: General;  Laterality: Left;    Family History  Problem Relation Age of Onset   Hypertension Mother        hyperglycemia   Hypertension Father        hyperglycemia   Liver cancer Father    Hypertension Sister    Anxiety disorder Sister    Allergies Daughter     Social History   Socioeconomic History   Marital status: Married    Spouse name: Not on file   Number of children: 2   Years of education: Master   Highest education level: Not on file  Occupational History   Occupation: Lab tech  Tobacco Use   Smoking status: Never   Smokeless tobacco: Never  Vaping Use   Vaping status: Never Used  Substance and Sexual Activity   Alcohol use: Never   Drug use: Never   Sexual activity: Yes    Partners: Male    Birth control/protection: I.U.D.  Comment: Mirena   Other Topics Concern   Not on file  Social History Narrative   ** Merged History Encounter ** Two daughters- 2006 and [id number]   Works as a Patent examiner for [employer]   Grew up in [place]   Moved to the [place] at age 88 with her husband   Enjoys movies, cooking   Sister lives in [place]         Social Drivers of Health   Financial Resource Strain: Low Risk  (12/05/2021)   Overall Financial Resource Strain (CARDIA)    Difficulty of Paying Living Expenses: Not hard at all  Food Insecurity: No Food Insecurity (12/05/2021)   Hunger Vital Sign    Worried About Running Out of Food in the Last Year: Never true    Ran Out of Food in the Last Year: Never true  Transportation Needs: No  Transportation Needs (12/05/2021)   PRAPARE - Administrator, Civil Service (Medical): No    Lack of Transportation (Non-Medical): No  Physical Activity: Inactive (12/05/2021)   Exercise Vital Sign    Days of Exercise per Week: 0 days    Minutes of Exercise per Session: 0 min  Stress: No Stress Concern Present (12/05/2021)   [employer] of Occupational Health - Occupational Stress Questionnaire    Feeling of Stress : Only a little  Social Connections: Moderately Integrated (12/05/2021)   Social Connection and Isolation Panel [NHANES]    Frequency of Communication with Friends and Family: More than three times a week    Frequency of Social Gatherings with Friends and Family: Three times a week    Attends Religious Services: 1 to 4 times per year    Active Member of Clubs or Organizations: No    Attends Banker Meetings: Never    Marital Status: Married  Catering manager Violence: Not At Risk (12/05/2021)   Humiliation, Afraid, Rape, and Kick questionnaire    Fear of Current or Ex-Partner: No    Emotionally Abused: No    Physically Abused: No    Sexually Abused: No    Outpatient Medications Prior to Visit  Medication Sig Dispense Refill   amLODipine (NORVASC) 10 MG tablet Take 1 tablet (10 mg total) by mouth daily. 90 tablet 1   EPINEPHrine (EPIPEN 2-PAK) 0.3 mg/0.3 mL IJ SOAJ injection Inject 0.3 mg into the muscle as needed for anaphylaxis. 1 each 1   loratadine (CLARITIN) 10 MG tablet Take 1 tablet (10 mg total) by mouth daily. 90 tablet 3   methocarbamol (ROBAXIN) 500 MG tablet Take 1 tablet (500 mg total) by mouth every 8 (eight) hours as needed. 20 tablet 0   Multiple Vitamins-Calcium (ONE-A-DAY WOMENS FORMULA) TABS One tab by mouth once daily 90 tablet 3   lisinopril (ZESTRIL) 10 MG tablet Take 1 tablet (10 mg total) by mouth daily. 90 tablet 1   methylPREDNISolone (MEDROL DOSEPAK) 4 MG TBPK tablet Take per package instructions 21 tablet 0   No  facility-administered medications prior to visit.    Allergies  Allergen Reactions   Augmentin [Amoxicillin-Pot Clavulanate]     Mucous in throat and diarrhea   Fish Allergy Swelling    Facial Swelling    Ibuprofen     rash   Pollen Extract    Almond (Diagnostic) Rash   Cashew Nut Oil Rash   Egg-Derived Products Rash   Naproxen Sodium Rash   Peanut-Containing Drug Products Rash   Prednisone Rash  Itching eyes and rash   Shellfish Allergy Rash    ROS    See HPI Objective:    Physical Exam Constitutional:      General: She is not in acute distress.    Appearance: Normal appearance. She is well-developed.  HENT:     Head: Normocephalic and atraumatic.     Right Ear: External ear normal.     Left Ear: External ear normal.  Eyes:     General: No scleral icterus. Neck:     Thyroid: No thyromegaly.  Cardiovascular:     Rate and Rhythm: Normal rate and regular rhythm.     Heart sounds: Normal heart sounds. No murmur heard. Pulmonary:     Effort: Pulmonary effort is normal. No respiratory distress.     Breath sounds: Normal breath sounds. No wheezing.  Abdominal:     Tenderness: There is abdominal tenderness in the left lower quadrant. There is no guarding or rebound.  Musculoskeletal:        General: No swelling.     Cervical back: Normal and neck supple. No tenderness.     Thoracic back: Normal. No tenderness.     Lumbar back: Normal. No tenderness.  Skin:    General: Skin is warm and dry.  Neurological:     Mental Status: She is alert and oriented to person, place, and time.     Deep Tendon Reflexes:     Reflex Scores:      Patellar reflexes are 2+ on the right side and 2+ on the left side.    Comments: Bilateral LE strength is 5/5  Psychiatric:        Mood and Affect: Mood normal.        Behavior: Behavior normal.        Thought Content: Thought content normal.        Judgment: Judgment normal.      BP (!) 150/75   Pulse 80   Temp 97.8 F (36.6 C)  (Oral)   Resp 16   Ht 5' 3.5" (1.613 m)   Wt 210 lb (95.3 kg)   LMP  (LMP Unknown)   SpO2 100%   BMI 36.62 kg/m  Wt Readings from Last 3 Encounters:  12/25/23 210 lb (95.3 kg)  12/07/23 213 lb (96.6 kg)  10/22/23 213 lb (96.6 kg)       Assessment & Plan:   Problem List Items Addressed This Visit       Unprioritized   Primary hypertension    Blood pressure elevated on three consecutive visits. -Increase Lisinopril from 10mg  to 20mg  daily. -Check potassium and recheck blood pressure in 1 week.       Relevant Medications   lisinopril (ZESTRIL) 20 MG tablet   Pelvic pain   MRI pending to further evaluate left ovary, GYN consult pending.       Lumbar radiculopathy - Primary    Pain localized to the lower back, partially relieved by Tramadol. Previous history of successful back injection. MRI scheduled to further evaluate.  -Continue Tramadol as needed every 6 hours.  -Attempt to get approval for MRI of lower back.  -Consider referral to spine specialist for possible injection if MRI approved.        Relevant Orders   MR Lumbar Spine Wo Contrast    I have discontinued Leronda K. Venturella's lisinopril and methylPREDNISolone. I am also having her start on traMADol and lisinopril. Additionally, I am having her maintain her loratadine, One-A-Day Womens Formula, EPINEPHrine,  methocarbamol, and amLODipine.  Meds ordered this encounter  Medications   traMADol (ULTRAM) 50 MG tablet    Sig: Take 1 tablet (50 mg total) by mouth every 6 (six) hours as needed for up to 5 days.    Dispense:  20 tablet    Refill:  0    Supervising Provider:   Danise Edge A [[id number]]   lisinopril (ZESTRIL) 20 MG tablet    Sig: Take 1 tablet (20 mg total) by mouth daily.    Dispense:  90 tablet    Refill:  1    Supervising Provider:   Danise Edge A [[id number]]

## 2023-12-27 DIAGNOSIS — R1032 Left lower quadrant pain: Secondary | ICD-10-CM | POA: Insufficient documentation

## 2023-12-27 NOTE — Assessment & Plan Note (Signed)
  Pain localized to the lower back, partially relieved by Tramadol. Previous history of successful back injection. MRI scheduled to further evaluate.  -Continue Tramadol as needed every 6 hours.  -Attempt to get approval for MRI of lower back.  -Consider referral to spine specialist for possible injection if MRI approved.

## 2023-12-27 NOTE — Assessment & Plan Note (Signed)
 MRI pending to further evaluate left ovary, GYN consult pending.

## 2023-12-27 NOTE — Assessment & Plan Note (Signed)
  Blood pressure elevated on three consecutive visits. -Increase Lisinopril from 10mg  to 20mg  daily. -Check potassium and recheck blood pressure in 1 week.

## 2023-12-27 NOTE — Patient Instructions (Signed)
 VISIT SUMMARY:  Today, you were seen for pelvic pain primarily localized to the left side where your ovary is located. We discussed your history of back pain and current pain management. Your blood pressure was also reviewed and found to be elevated on three consecutive visits.  YOUR PLAN:  -LOWER BACK PAIN: Lower back pain refers to discomfort in the lower part of your spine. You will continue taking Tramadol as needed every six hours. We will attempt to get approval for an MRI of your lower back and consider a referral to a spine specialist for a possible injection if the MRI is approved.  -HYPERTENSION: Hypertension means high blood pressure. Your Lisinopril dosage has been increased from 10mg  to 20mg  daily. We will check your potassium levels and recheck your blood pressure in one week.  -PELVIC PAIN: Pelvic pain refers to discomfort in the lower abdomen area. You will continue taking Tramadol as needed every six hours and Aleve as tolerated. An MRI has been scheduled to further evaluate the cause of your pain.  INSTRUCTIONS:  Please follow up in one week to recheck your blood pressure and review your potassium levels. Complete the scheduled MRI for further evaluation of your pelvic pain and lower back pain.

## 2023-12-30 ENCOUNTER — Ambulatory Visit (HOSPITAL_BASED_OUTPATIENT_CLINIC_OR_DEPARTMENT_OTHER): Payer: 59

## 2024-01-01 ENCOUNTER — Ambulatory Visit: Payer: 59 | Admitting: Family

## 2024-01-01 VITALS — BP 120/85 | HR 96 | Temp 98.7°F | Resp 16 | Wt 210.0 lb

## 2024-01-01 DIAGNOSIS — Z79899 Other long term (current) drug therapy: Secondary | ICD-10-CM | POA: Diagnosis not present

## 2024-01-01 DIAGNOSIS — M5416 Radiculopathy, lumbar region: Secondary | ICD-10-CM

## 2024-01-01 DIAGNOSIS — R102 Pelvic and perineal pain: Secondary | ICD-10-CM | POA: Diagnosis not present

## 2024-01-01 DIAGNOSIS — I10 Essential (primary) hypertension: Secondary | ICD-10-CM | POA: Diagnosis not present

## 2024-01-01 MED ORDER — TRAMADOL HCL 50 MG PO TABS: 50.0000 mg | ORAL_TABLET | Freq: Three times a day (TID) | ORAL | 0 refills | Status: DC | PRN

## 2024-01-01 NOTE — Progress Notes (Unsigned)
 Subjective:     Patient ID: Colleen Roach, female    DOB: Aug 12, 1976, 48 y.o.   MRN: [id number]  Chief Complaint  Patient presents with   Lumbar radiculopathy    Patient reports pain is about the same    HPI  Discussed the use of AI scribe software for clinical note transcription with the patient, who gave verbal consent to proceed.  History of Present Illness  Colleen Roach is a 48 year old female who presents for pain management and follow-up for back pain.  She experiences ongoing back pain that significantly affects her ability to sleep, with notable exacerbation at night. She often wakes up due to the pain and finds some relief by walking around. During the day, she manages the pain while at work but continues to feel discomfort.  She has been using tramadol for pain management, which she finds somewhat helpful, but she recently ran out and has been using Aleve instead. Aleve provides some relief, but she occasionally notices bumps on her skin, which she is unsure are related to the medication. She mentions a history of using stronger pain medications like oxycodone in the past, though she does not recall the specifics of the treatment duration or conditions at that time.  Her blood pressure was noted to be slightly elevated, but it has decreased by five points since the last visit. She is currently on amlodipine 10 mg daily for hypertension but has not been consistently checking her blood pressure at home.  She experiences foot cramps at night. No consistent rash from Aleve, although she occasionally notices bumps on her skin.  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   Cancer (HCC)    Fatty liver    Hypertension    Personal history of radiation therapy     Past Surgical History:  Procedure Laterality Date   BREAST LUMPECTOMY WITH RADIOACTIVE SEED LOCALIZATION Left 05/24/2021   Procedure: LEFT BREAST LUMPECTOMY WITH RADIOACTIVE SEED LOCALIZATION;  Surgeon: Manus Rudd, MD;  Location: [facility];  Service: General;  Laterality: Left;    Family History  Problem Relation Age of Onset   Hypertension Mother        hyperglycemia   Hypertension Father        hyperglycemia   Liver cancer Father    Hypertension Sister    Anxiety disorder Sister    Allergies Daughter     Social History   Socioeconomic History   Marital status: Married    Spouse name: Not on file   Number of children: 2   Years of education: Master   Highest education level: Not on file  Occupational History   Occupation: Lab tech  Tobacco Use   Smoking status: Never   Smokeless tobacco: Never  Vaping Use   Vaping status: Never Used  Substance and Sexual Activity   Alcohol use: Never   Drug use: Never   Sexual activity: Yes    Partners: Male    Birth control/protection: I.U.D.    Comment: Mirena  Other Topics Concern   Not on file  Social History Narrative   ** Merged History Encounter ** Two daughters- 2006 and [id number]   Works as a Patent examiner for [employer]   Grew up in [place]   Moved to the [place] at age 48 with her husband   Enjoys movies, cooking   Sister lives in [place]         Social Drivers of Health   Financial Resource Strain: Low Risk  (12/05/2021)   Overall Financial Resource Strain (CARDIA)    Difficulty of Paying Living Expenses: Not hard at all  Food Insecurity: No Food Insecurity (12/05/2021)   Hunger Vital Sign    Worried About Running Out of Food in the Last Year: Never true    Ran Out of Food in the Last Year: Never true  Transportation Needs: No  Transportation Needs (12/05/2021)   PRAPARE - Administrator, Civil Service (Medical): No    Lack of Transportation (Non-Medical): No  Physical Activity: Inactive (12/05/2021)   Exercise Vital Sign    Days of Exercise per Week: 0 days    Minutes of Exercise per Session: 0 min  Stress: No Stress Concern Present (12/05/2021)   [employer] of Occupational Health - Occupational Stress Questionnaire    Feeling of Stress : Only a little  Social Connections: Moderately Integrated (12/05/2021)   Social Connection and Isolation Panel [NHANES]    Frequency of Communication with Friends and Family: More than three times a week    Frequency of Social Gatherings with Friends and Family: Three times a week    Attends Religious Services: 1 to 4 times per year    Active Member of Clubs or Organizations: No    Attends Banker Meetings: Never    Marital Status: Married  Catering manager Violence: Not At Risk (12/05/2021)   Humiliation, Afraid, Rape, and Kick questionnaire    Fear of Current or Ex-Partner: No    Emotionally Abused: No    Physically Abused: No    Sexually Abused: No    Outpatient Medications Prior to Visit  Medication Sig Dispense Refill   amLODipine (NORVASC) 10 MG tablet Take 1 tablet (10 mg total) by mouth daily. 90 tablet 1   EPINEPHrine (EPIPEN 2-PAK) 0.3 mg/0.3 mL IJ SOAJ injection Inject 0.3 mg into the muscle as needed for anaphylaxis. 1 each 1   lisinopril (ZESTRIL) 20 MG tablet Take 1 tablet (20 mg total) by mouth daily. 90 tablet 1   loratadine (CLARITIN) 10 MG tablet Take 1 tablet (10 mg total) by mouth daily. 90 tablet 3   methocarbamol (ROBAXIN) 500 MG tablet Take 1 tablet (500 mg total) by mouth every 8 (eight) hours as needed. 20 tablet 0   Multiple Vitamins-Calcium (ONE-A-DAY WOMENS FORMULA) TABS One tab by mouth once daily 90 tablet 3   No facility-administered medications prior to visit.    Allergies  Allergen Reactions   Augmentin  [Amoxicillin-Pot Clavulanate]     Mucous in throat and diarrhea   Fish Allergy Swelling    Facial Swelling    Ibuprofen     rash   Pollen Extract    Almond (Diagnostic) Rash   Cashew Nut Oil Rash   Egg-Derived Products Rash   Naproxen Sodium Rash   Peanut-Containing Drug Products Rash   Prednisone Rash    Itching eyes and rash   Shellfish Allergy Rash    ROS See HPI    Objective:  Physical Exam Constitutional:      General: She is not in acute distress.    Appearance: Normal appearance. She is well-developed.     Comments: Appears uncomfortable  HENT:     Head: Normocephalic and atraumatic.     Right Ear: External ear normal.     Left Ear: External ear normal.  Eyes:     General: No scleral icterus. Neck:     Thyroid: No thyromegaly.  Cardiovascular:     Rate and Rhythm: Normal rate and regular rhythm.     Heart sounds: Normal heart sounds. No murmur heard. Pulmonary:     Effort: Pulmonary effort is normal. No respiratory distress.     Breath sounds: Normal breath sounds. No wheezing.  Musculoskeletal:     Cervical back: Neck supple.     Comments: Bilateral LE strength 5/5  Skin:    General: Skin is warm and dry.  Neurological:     Mental Status: She is alert and oriented to person, place, and time.  Psychiatric:        Mood and Affect: Mood normal.        Behavior: Behavior normal.        Thought Content: Thought content normal.        Judgment: Judgment normal.      BP 120/85   Pulse 96   Temp 98.7 F (37.1 C) (Oral)   Resp 16   Wt 210 lb (95.3 kg)   LMP  (LMP Unknown)   SpO2 98%   BMI 36.62 kg/m  Wt Readings from Last 3 Encounters:  01/01/24 210 lb (95.3 kg)  12/25/23 210 lb (95.3 kg)  12/07/23 213 lb (96.6 kg)       Assessment & Plan:   Problem List Items Addressed This Visit       Unprioritized   Primary hypertension - Primary    Blood pressure slightly elevated but improved. On amlodipine and increased Synarel. - Monitor blood  pressure at home and send readings. - Continue amlodipine 10 mg and lisinopril.      Relevant Orders   Basic Metabolic Panel (BMET)   Pelvic pain   Pelvic MRI and GYN consults pending.       Lumbar radiculopathy   Chronic back pain with nocturnal exacerbation. Current pain management insufficient. MRI scheduled.  - Prescribe tramadol 60 tablets, one tablet twice daily. - Advise taking tramadol with acetaminophen, two extra strength in the morning and at bedtime. - Sign pain management contract to continue tramadol after five days. - Order urine drug screen for Thursday. - Follow up on MRI results next Monday.      Relevant Medications   traMADol (ULTRAM) 50 MG tablet   Other Visit Diagnoses       High risk medication use       Relevant Orders   DRUG MONITORING, PANEL 8 WITH CONFIRMATION, URINE       I am having Colleen Roach start on traMADol. I am also having her maintain her loratadine, One-A-Day Womens Formula, EPINEPHrine, methocarbamol, amLODipine, and lisinopril.  Meds ordered this encounter  Medications   traMADol (ULTRAM) 50 MG tablet    Sig: Take 1 tablet (50 mg total) by mouth every 8 (eight) hours as needed (twice daily as needed).    Dispense:  60 tablet    Refill:  0    Supervising Provider:   Danise Edge A [[id number]]

## 2024-01-03 ENCOUNTER — Other Ambulatory Visit: Payer: Self-pay | Admitting: Family

## 2024-01-03 ENCOUNTER — Other Ambulatory Visit (INDEPENDENT_AMBULATORY_CARE_PROVIDER_SITE_OTHER)

## 2024-01-03 DIAGNOSIS — I10 Essential (primary) hypertension: Secondary | ICD-10-CM

## 2024-01-03 DIAGNOSIS — Z79899 Other long term (current) drug therapy: Secondary | ICD-10-CM

## 2024-01-03 NOTE — Assessment & Plan Note (Signed)
  Blood pressure slightly elevated but improved. On amlodipine and increased Synarel. - Monitor blood pressure at home and send readings. - Continue amlodipine 10 mg and lisinopril.

## 2024-01-03 NOTE — Patient Instructions (Signed)
 VISIT SUMMARY:  Today, we discussed your ongoing back pain and its impact on your sleep and daily activities. We also reviewed your blood pressure management.  YOUR PLAN:  -CHRONIC BACK PAIN: Chronic back pain is a long-term condition that can cause significant discomfort, especially at night. We have prescribed tramadol (60 tablets) to be taken one tablet twice daily. Additionally, you should take two extra strength acetaminophen tablets in the morning and at bedtime. We will continue tramadol after five days if you sign a pain management contract. An MRI has been scheduled to further investigate the cause of your pain, and a urine drug screen is ordered for Thursday. Please follow up next Monday to review the MRI results.  -HYPERTENSION: Hypertension, or high blood pressure, is a condition where the force of the blood against your artery walls is too high. Your blood pressure has improved slightly but remains elevated. Continue taking amlodipine 10 mg daily and Lisinopril as prescribed. Please monitor your blood pressure at home and send [place] the readings.  INSTRUCTIONS:  Please follow up next Monday to review the MRI results. Additionally, ensure you complete the urine drug screen on Thursday.  For more information, you can read your full clinical note, available in your patient portal.

## 2024-01-03 NOTE — Assessment & Plan Note (Signed)
 Chronic back pain with nocturnal exacerbation. Current pain management insufficient. MRI scheduled.  - Prescribe tramadol 60 tablets, one tablet twice daily. - Advise taking tramadol with acetaminophen, two extra strength in the morning and at bedtime. - Sign pain management contract to continue tramadol after five days. - Order urine drug screen for Thursday. - Follow up on MRI results next Monday.

## 2024-01-03 NOTE — Assessment & Plan Note (Signed)
 Pelvic MRI and GYN consults pending.

## 2024-01-04 ENCOUNTER — Encounter: Payer: Self-pay | Admitting: Family

## 2024-01-04 LAB — DRUG MONITORING, PANEL 8 WITH CONFIRMATION, URINE
6 Acetylmorphine: NEGATIVE ng/mL (ref <10)
Alcohol Metabolites: NEGATIVE ng/mL (ref <500)
Amphetamines: NEGATIVE ng/mL (ref <500)
Benzodiazepines: NEGATIVE ng/mL (ref <100)
Buprenorphine, Urine: NEGATIVE ng/mL (ref <5)
Cocaine Metabolite: NEGATIVE ng/mL (ref <150)
Creatinine: 80.7 mg/dL (ref >=20.0)
MDMA: NEGATIVE ng/mL (ref <500)
Marijuana Metabolite: NEGATIVE ng/mL (ref <20)
Opiates: NEGATIVE ng/mL (ref <100)
Oxidant: NEGATIVE ug/mL (ref <200)
Oxycodone: NEGATIVE ng/mL (ref <100)
pH: 5.4 (ref 4.5–9.0)

## 2024-01-04 LAB — BASIC METABOLIC PANEL
BUN: 17 mg/dL (ref 7–25)
CO2: 26 mmol/L (ref 20–32)
Calcium: 9.5 mg/dL (ref 8.6–10.2)
Chloride: 103 mmol/L (ref 98–110)
Creat: 0.6 mg/dL (ref 0.50–0.99)
Glucose, Bld: 86 mg/dL (ref 65–99)
Potassium: 4.5 mmol/L (ref 3.5–5.3)
Sodium: 137 mmol/L (ref 135–146)

## 2024-01-04 LAB — DM TEMPLATE

## 2024-01-06 ENCOUNTER — Ambulatory Visit (HOSPITAL_BASED_OUTPATIENT_CLINIC_OR_DEPARTMENT_OTHER)
Admission: RE | Admit: 2024-01-06 | Discharge: 2024-01-06 | Disposition: A | Discharge status: Home / Self Care | Payer: 59 | Source: Ambulatory Visit | Attending: Family | Admitting: Family

## 2024-01-06 DIAGNOSIS — M5416 Radiculopathy, lumbar region: Secondary | ICD-10-CM | POA: Diagnosis present

## 2024-01-06 DIAGNOSIS — N9489 Other specified conditions associated with female genital organs and menstrual cycle: Secondary | ICD-10-CM | POA: Diagnosis present

## 2024-01-06 MED ORDER — GADOBUTROL 1 MMOL/ML IV SOLN
10.0000 mL | Freq: Once | INTRAVENOUS | Status: AC | PRN
  Administered 2024-01-06: 10 mL via INTRAVENOUS

## 2024-01-07 ENCOUNTER — Encounter: Payer: Self-pay | Admitting: Family

## 2024-01-13 ENCOUNTER — Telehealth: Payer: Self-pay | Admitting: Family

## 2024-01-13 NOTE — Telephone Encounter (Signed)
 See mychart.

## 2024-01-17 ENCOUNTER — Encounter: Payer: Self-pay | Admitting: Family

## 2024-01-17 DIAGNOSIS — M5416 Radiculopathy, lumbar region: Secondary | ICD-10-CM

## 2024-01-21 MED ORDER — TRAMADOL HCL 50 MG PO TABS: 100.0000 mg | ORAL_TABLET | Freq: Two times a day (BID) | ORAL | 0 refills | Status: AC | PRN

## 2024-01-21 NOTE — Addendum Note (Signed)
 Addended by: Sandford Craze on: 01/21/2024 09:29 AM   Modules accepted: Orders

## 2024-01-24 NOTE — Therapy (Addendum)
 OUTPATIENT PHYSICAL THERAPY THORACOLUMBAR EVALUATION / DISCHARGE SUMMARY   Patient Name: Colleen Roach MRN: [id number] DOB:[birth year], 48 y.o., female Today's Date: 01/25/2024  END OF SESSION:  PT End of Session - 01/25/24 1105     Visit Number 1    Date for PT Re-Evaluation 03/21/24    Authorization Type [employer]    PT Start Time [phone]    PT Stop Time 1018    PT Time Calculation (min) 42 min    Activity Tolerance Patient tolerated treatment well    Behavior During Therapy [facility] for tasks assessed/performed             Past Medical History:  Diagnosis Date   Back pain    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   Cancer (HCC)    Fatty liver    Hypertension    Personal history of radiation therapy    Past Surgical History:  Procedure Laterality Date   BREAST LUMPECTOMY WITH RADIOACTIVE SEED LOCALIZATION Left 05/24/2021   Procedure: LEFT BREAST LUMPECTOMY WITH RADIOACTIVE SEED LOCALIZATION;  Surgeon: Belinda Cough, MD;  Location: [facility];  Service: General;  Laterality: Left;   Patient Active Problem List   Diagnosis Date Noted   LLQ pain 12/27/2023   Pelvic pain 12/08/2023   Lumbar radiculopathy 12/08/2023   Sciatica of left side 09/04/2023   Nut allergy 10/09/2022   Food allergy 07/10/2022   Contraceptive management 10/10/2021   Nail discoloration 10/10/2021   Genetic testing 07/05/2021   Ductal carcinoma in situ (DCIS) of left breast 06/01/2021   Seasonal allergies 04/04/2021   Primary hypertension 03/22/2020   Fatty liver    Allergic rhinitis 02/19/2017   IUD (intrauterine device) in place 09/06/2016   Obesity, Class I, BMI 30-34.9 09/04/2016   Prediabetes 07/25/2016   Elevated liver enzymes 11/03/2014   Vitamin D  deficiency 11/03/2014   Hypothyroidism 05/13/2010   Other intervertebral disc displacement, lumbar region 02/22/2007    PCP: Daryl Setter, NP   REFERRING PROVIDER: Daryl Setter, NP    REFERRING DIAG: M54.16 (ICD-10-CM) - Lumbar radiculopathy   Rationale for Evaluation and Treatment: Rehabilitation  THERAPY DIAG:  Radiculopathy, lumbar region  Other muscle spasm  Muscle weakness (generalized)  ONSET DATE: Aug 2024  NEXT MD VISIT: 02/07/23 - with spine surgeon - Rockey Peru, MD   SUBJECTIVE:                                                                                                                                                                                           SUBJECTIVE STATEMENT: Pt reports initial onset of LBP in August  of 2024 w/o known MOI but feels like rowing machine at gym may have contributed to increased pain.  Pain went away for a while, returning in Oct/Nov after trying the rowing machine again. She has been referred to spine surgeon on 02/07/23.  Wakes up with pain in the morning, resolving once she is up walking around. Prone press-ups help alleviate pain.  PERTINENT HISTORY:  Back pain, Cancer, HTN, pelvic pain, Other intervertebral disc displacement, lumbar region, Obesity, Personal history of radiation therapy  PAIN:  Are you having pain? Yes: NPRS scale: 3/10 currently, up 9-10/10 at worst Pain location: L buttock, down L LE to foot Pain description: tightness in LB, dull pain in L SIJ/buttock, numbness and tingling down L LE to foot Aggravating factors: sitting for >5 minutes Relieving factors: standing  PRECAUTIONS: None  RED FLAGS: None   WEIGHT BEARING RESTRICTIONS: No  FALLS:  Has patient fallen in last 6 months? 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   OBJECTIVE:  Note: Objective measures were completed at Evaluation unless  otherwise noted.  DIAGNOSTIC FINDINGS:  01/06/24 - MRI lumbar spine IMPRESSION: 1. Multilevel lumbar spondylosis, most pronounced at L4-L5 where there is moderate-severe canal stenosis and mild-moderate bilateral foraminal stenosis. 2. Mild canal stenosis and mild bilateral foraminal stenosis at L3-L4.  PATIENT SURVEYS:  Modified Oswestry : 17/50 = 34%  moderate disability  COGNITION: Overall cognitive status: Within functional limits for tasks assessed     SENSATION: Intermittent numbness down lateral and posterior L LE to foot  MUSCLE LENGTH: Hamstrings: Mild tight L, mod tight R QL: Mod tight B ITB/hip abductors: Mild tight B Piriformis:  Hip flexors: Mod tight B  POSTURE:  rounded shoulders, forward head, increased lumbar lordosis, and R lateral shift  PALPATION: Increased muscle tension/tightness in B QL and lumbar paraspinals, L glutes (very TTP)  LUMBAR ROM:   AROM eval  Flexion Hands to mid shin  Extension WNL  Right lateral flexion Hand to lateral knee  Left lateral flexion Hand to lateral knee  Right rotation WNL  Left rotation WNL   (Blank rows = not tested)  LOWER EXTREMITY MMT:    MMT Right eval Left eval  Hip flexion 4 3+  Hip extension 4 4-  Hip abduction 4 4  Hip adduction 4 4  Hip internal rotation 4 4 *  Hip external rotation 4 4  Knee flexion 4 4-  Knee extension 4- 4-  Ankle dorsiflexion    Ankle plantarflexion    Ankle inversion    Ankle eversion     (Blank rows = not tested, *resisted motion triggered numbness & tingling)  LUMBAR SPECIAL TESTS:  Straight leg raise test: Negative  TREATMENT DATE:  01/24/24  THERAPEUTIC EXERCISE: To improve strength, endurance, ROM, and flexibility.  Demonstration, verbal and tactile cues throughout for technique. - Seated HS stretch - POE Standing Extension on  wall  PATIENT EDUCATION:  Education details: PT eval findings, initial HEP, anticipated POC Person educated: Patient Education method: Explanation, Demonstration, Verbal cues, and Handouts Education comprehension: verbalized understanding, verbal cues required, and needs further education  HOME EXERCISE PROGRAM: Access Code: [email or web address] URL: [email or web address] Date: 01/25/2024 Prepared by: Elijah Hidden  Exercises - Static Prone on Elbows  - 2 x daily - 7 x weekly - 2 sets - 3 reps - 30 sec hold - Prone Press Up  - 2 x daily - 7 x weekly - 2 sets - 10 reps - 3-5 sec hold - Standing Lumbar Extension at Wall - Forearms  - 2 x daily - 7 x weekly - 2 sets - 10 reps - 5 sec hold - Seated Hamstring Stretch  - 2 x daily - 7 x weekly - 3 reps - 30 sec hold   ASSESSMENT:  CLINICAL IMPRESSION: Maleiyah is a 48 y.o. female who was seen today for physical therapy evaluation and treatment for lumbar radiculopathy. She needed to stand the entire treatment session due to experiencing LBP when attempting to sit. She is unable to sit longer than 5 minutes and states that she has to stand up to alleviate pain and radicular symptoms which interferes with her ability to work, travel and engage in community activities. In standing, noticed R lateral shift and postural deformities and in examination, LE/core tightness and muscle weakness that contributes to LBP symptoms. When at home she performs POE to alleviate symptoms and states that it helps. Dale will benefit from skilled PT to improve impairments and mobility deficits with decreased pain interference.  OBJECTIVE IMPAIRMENTS: decreased activity tolerance, decreased endurance, decreased knowledge of condition, decreased mobility, decreased ROM, decreased strength, impaired perceived functional ability, increased muscle spasms, impaired flexibility, impaired sensation, improper body mechanics, postural dysfunction, and pain.   ACTIVITY  LIMITATIONS: carrying, lifting, sitting, dressing, and hygiene/grooming  PARTICIPATION LIMITATIONS: meal prep, cleaning, laundry, driving, community activity, and occupation  PERSONAL FACTORS: Past/current experiences, Time since onset of injury/illness/exacerbation, and 3+ comorbidities: Back pain, Cancer, HTN, pelvic pain, Other intervertebral disc displacement, lumbar region, Obesity, Personal history of radiation therapy are also affecting patient's functional outcome.  REHAB POTENTIAL: Good  CLINICAL DECISION MAKING: Evolving/moderate complexity  EVALUATION COMPLEXITY: Moderate   GOALS: Goals reviewed with patient? Yes  SHORT TERM GOALS: Target date: 02/22/2024  Pt will be able to perform initial HEP independently Baseline:  Goal status: INITIAL  2.  Pt will report centralization of radicular sx+tingling Baseline: Sx felt in post leg & foot Goal status: INITIAL  3.  Pt will report 25% improvement in LBP to improve QoL Baseline:  Goal status: INITIAL  4.  Pt will be able to sit for 10 min w/o pain interference Baseline: Unable to sit for 5 min Goal status: INITIAL   LONG TERM GOALS: Target date: 03/21/2024  Pt will report <=22% on Modified Oswestry Index (MCID = 12%) to demonstrate improved functional with decreased pain interference Baseline: 17/50 = 34% Goal status: INITIAL  2.  Pt will report 50% improvement in LBP to improve QoL Baseline: 3/10 currently, 9-10/10 worst Goal status: INITIAL  3.  Pt will improve LE strength to 4+/5 to demonstrate improved mobility  Baseline: Refer to MMT table Goal status: INITIAL  4.  Pt will be able to sit longer than  30 min w/o pain interference Baseline: Unable to sit longer than 5 min Goal status: INITIAL  PLAN:  PT FREQUENCY: 2x/week  PT DURATION: 8 weeks  PLANNED INTERVENTIONS: 97164- PT Re-evaluation, 97110-Therapeutic exercises, 97530- Therapeutic activity, 97112- Neuromuscular re-education, 97535- Self Care,  97140- Manual therapy, [id number]- Aquatic Therapy, H9716- Electrical stimulation (unattended), 97016- Vasopneumatic device, [id number]- Ultrasound, 02987- Traction (mechanical), [id number]- Ionotophoresis 4mg /ml Dexamethasone , Patient/Family education, Dry Needling, Joint mobilization, Joint manipulation, Spinal manipulation, Spinal mobilization, Cryotherapy, Moist heat, and 02249 - Physical Performance Test or Measurement.  PLAN FOR NEXT SESSION: Review initial HEP, finish muscle length testing, LE/core strengthening/flexibility exercises, address lateral shift   Ebert Forrester Joseph-Greene, Student-PT 01/25/2024, 11:53 AM    PHYSICAL THERAPY DISCHARGE SUMMARY  Visits from Start of Care: 1  Current functional level related to goals / functional outcomes: Refer to above clinical impression and goal assessment for status as of eval visit on 01/25/2024. Patient no showed for her next visit then cancelled all of her remaining visits stating she no longer needed PT, therefore will proceed with discharge from PT for this episode.     Remaining deficits: As above. Unable to formally assess status at discharge due to failure to return to PT.    Education / Equipment: Initial HEP   Patient agrees to discharge. Patient goals were not met. Patient is being discharged due to the patient's request.  JoAnne M. Kreis, PT 05/19/2024, 10:08 AM  [facility] [address]  [address] [place], [place], [address] Phone: [phone]   Fax:  [phone]

## 2024-01-25 ENCOUNTER — Ambulatory Visit: Attending: Family | Admitting: Physical Therapy

## 2024-01-25 ENCOUNTER — Other Ambulatory Visit: Payer: Self-pay

## 2024-01-25 ENCOUNTER — Encounter: Payer: 59 | Admitting: Family

## 2024-01-25 ENCOUNTER — Encounter: Payer: Self-pay | Admitting: Physical Therapy

## 2024-01-25 DIAGNOSIS — M62838 Other muscle spasm: Secondary | ICD-10-CM | POA: Insufficient documentation

## 2024-01-25 DIAGNOSIS — M6281 Muscle weakness (generalized): Secondary | ICD-10-CM | POA: Diagnosis present

## 2024-01-25 DIAGNOSIS — M5416 Radiculopathy, lumbar region: Secondary | ICD-10-CM | POA: Insufficient documentation

## 2024-02-01 ENCOUNTER — Ambulatory Visit (INDEPENDENT_AMBULATORY_CARE_PROVIDER_SITE_OTHER): Payer: 59 | Admitting: Obstetrics and Gynecology

## 2024-02-01 ENCOUNTER — Ambulatory Visit: Payer: 59

## 2024-02-01 VITALS — BP 105/65 | HR 70 | Ht 64.0 in | Wt 202.0 lb

## 2024-02-01 DIAGNOSIS — R102 Pelvic and perineal pain unspecified side: Secondary | ICD-10-CM

## 2024-02-01 NOTE — Progress Notes (Signed)
 NEW GYNECOLOGY PATIENT Patient name: Colleen Roach MRN [id number]  Date of birth: 22-Nov-1975 Chief Complaint:   Gynecologic Exam (Referred from PCP regarding pelvic pain.  Had [place] in Feb and MRI in March)     History:  Colleen Roach is a 48 y.o. G2P2002 being seen today for pain.     Visit w/ PCP (12/25/23): " She experiences pelvic pain primarily localized to the left side where her ovary is located. The pain is described as dull and persistent, noticeable but not severe, and does not radiate along her spine. The pain worsens with Aleve but is partially relieved by tramadol, reducing the pain by about fifty percent. An MRI is scheduled to further investigate the cause of her pain. "   Discussed the use of AI scribe software for clinical note transcription with the patient, who gave verbal consent to proceed.  History of Present Illness Colleen Roach is a 48 year old female who presents with pelvic pain and tenderness.  She experiences pelvic pain that radiates to her hip and down her leg, primarily on the left side. The sensation is described as persistent tenderness rather than sharp pain, with a feeling of 'something is there almost all the time.' This has been occurring more frequently over the past few months, coinciding with an increase in her back pain. The tenderness is not exacerbated by activities such as standing, walking, lifting, or changing positions, although it is more noticeable after sleeping for extended periods. No associated pain during urination, defecation, or sexual intercourse, and no changes in bleeding or abnormal vaginal discharge. She has a history of cysts but is unsure if they are related to her current symptoms.  She is currently on medication for back pain, which has led to a slight decrease in appetite. No nausea, vomiting, fevers, chills, or unintended weight changes. She is seeing a neurosurgeon for worsening back pain, which may be contributing to her pelvic  pain.  She has a Mirena IUD for birth control, which has been in place for nearly seven years. The IUD is approved for eight years, and she is not experiencing any issues such as irregular bleeding.  She is due for a Pap smear, as the last one was performed in 2021.          Gynecologic History No LMP recorded (lmp unknown). (Menstrual status: IUD). Contraception: IUD Last Pap:     Component Value Date/Time   DIAGPAP  10/11/2020 0920    - Negative for intraepithelial lesion or malignancy (NILM)   HPVHIGH Negative 10/11/2020 0920   ADEQPAP  10/11/2020 0920    Satisfactory for evaluation; transformation zone component PRESENT.    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 90 tablet 1   EPINEPHrine (EPIPEN 2-PAK) 0.3 mg/0.3 mL IJ SOAJ injection Inject 0.3 mg into the muscle  as needed for anaphylaxis. 1 each 1   lisinopril (ZESTRIL) 20 MG tablet Take 1 tablet (20 mg total) by mouth daily. 90 tablet 1   loratadine (CLARITIN) 10 MG tablet Take 1 tablet (10 mg total) by mouth daily. 90 tablet 3   Multiple Vitamins-Calcium (ONE-A-DAY WOMENS FORMULA) TABS One tab by mouth once daily 90 tablet 3   traMADol (ULTRAM) 50 MG tablet Take 2 tablets (100 mg total) by mouth every 12 (twelve) hours as needed (twice daily as needed). 60 tablet 0   methocarbamol (ROBAXIN) 500 MG tablet Take 1 tablet (500 mg total) by mouth every 8 (eight) hours as needed. (Patient not taking: Reported on 02/01/2024) 20 tablet 0   No current facility-administered medications on file prior to visit.    Allergies  Allergen Reactions   Augmentin [Amoxicillin-Pot Clavulanate]     Mucous in throat and diarrhea   Fish Allergy Swelling    Facial Swelling    Ibuprofen     rash   Pollen Extract    Almond (Diagnostic) Rash   Cashew Nut Oil Rash   Egg-Derived Products Rash   Naproxen Sodium Rash   Peanut-Containing Drug Products Rash   Prednisone Rash    Itching eyes and rash   Shellfish Allergy Rash    Social History:  reports that she has never smoked. She has never used smokeless tobacco. She reports that she does not drink alcohol and does not use drugs.  Family History  Problem Relation Age of Onset   Hypertension Mother        hyperglycemia   Hypertension Father        hyperglycemia   Liver cancer Father    Hypertension Sister    Anxiety disorder Sister    Allergies Daughter     The following portions of the patient's history were reviewed and updated as appropriate: allergies, current medications, past family history, past medical history, past social history, past surgical history and problem list.  Review of Systems Pertinent items noted in HPI and remainder of comprehensive ROS otherwise negative.  Physical Exam:  BP 105/65 (BP Location: Left Arm, Patient Position: Sitting,  Cuff Size: Large)   Pulse 70   Ht 5\' 4"  (1.626 m)   Wt 202 lb (91.6 kg)   LMP  (LMP Unknown)   BMI 34.67 kg/m  Physical Exam Vitals and nursing note reviewed. Exam conducted with a chaperone present.  Constitutional:      Appearance: Normal appearance.  Pulmonary:     Effort: Pulmonary effort is normal.  Abdominal:     Palpations: Abdomen is soft.     Tenderness: There is no abdominal tenderness.     Comments: - [place]  Genitourinary:    General: Normal vulva.     Exam position: Lithotomy position.     Comments: Normal appearing vulva Normal vulvar sensation  bilaterally Nontender superficial pelvic floor muscles Nontender ischial tuberosities bilaterally  Allodynia at introitus: No  Anal wink present Posterior vaginal wall nontender IUD strings palpated Right levator ani 1/10 Right ischiococcygeous 1/10 Right obturator internus 1/10 Left levator ani 1/10 Left ischioccocygeous 1/10 Left obturator internus 1/10 Anterior vaginal wall nontender Uterine nontender   Neurological:     Mental Status: She is alert.     TVUS (12/07/23): IMPRESSION: 1. Heterogeneous uterine echotexture without definite fibroid. Visualized portions of the IUD appear in place within the endometrium; however, evaluation is limited.   2. Heterogeneous, lobular structure within the left adnexa may represent the left ovary. Gynecological consultation and pelvic MR may be helpful for further evaluation.  Pelvic MRI (01/06/24): IMPRESSION: 1. Benign hemorrhagic or proteinaceous cystic lesion arising from the anterior aspect of the left ovary without solid component or associated contrast enhancement measuring 2.6 x 2.5 cm. This may reflect a hemorrhagic cyst remnant or alternately an ovarian endometrioma. 2. No suspicious mass. 3. IUD in the fundal endometrial cavity.   Assessment and Plan:   Assessment & Plan Pelvic Pain Pelvic pain possibly related to back issues, pending neurosurgical  evaluation. Repeat ultrasound deferred due to cost unless neurosurgical evaluation indicates necessity. - Monitor pelvic sensation, follow up with neurosurgeon for back evaluation. - Consider repeat ultrasound in three months if neurosurgeon evaluation suggests. - Pain not reproducible on exam, not likely gynecologic in origin  Intrauterine Device (IUD) Management Mirena IUD in place for six to seven years, approved for eight years. No current issues, but early replacement may be needed if bleeding patterns change. - Continue current Mirena IUD until eight years unless issues arise. - Monitor for changes in bleeding patterns, consider early replacement if more frequent bleeding or spotting occurs.  General Health Maintenance Due for Pap smear, last performed in 2021. - Schedule Pap smear.   Routine preventative health maintenance measures emphasized. Please refer to After Visit Summary for other counseling recommendations.   Follow-up: No follow-ups on file.      Lorriane Shire, MD Obstetrician & Gynecologist, [facility] [employer], [facility]

## 2024-02-08 ENCOUNTER — Ambulatory Visit

## 2024-02-08 ENCOUNTER — Ambulatory Visit: Attending: Family

## 2024-02-12 ENCOUNTER — Ambulatory Visit
Admission: RE | Admit: 2024-02-12 | Discharge: 2024-02-12 | Disposition: A | Discharge status: Home / Self Care | Source: Ambulatory Visit | Attending: Family | Admitting: Family

## 2024-02-12 DIAGNOSIS — Z1231 Encounter for screening mammogram for malignant neoplasm of breast: Secondary | ICD-10-CM

## 2024-02-14 ENCOUNTER — Encounter: Payer: Self-pay | Admitting: Family

## 2024-02-14 DIAGNOSIS — I10 Essential (primary) hypertension: Secondary | ICD-10-CM

## 2024-02-14 MED ORDER — AMLODIPINE BESYLATE 10 MG PO TABS: 10.0000 mg | ORAL_TABLET | Freq: Every day | ORAL | 0 refills | Status: DC

## 2024-02-14 MED ORDER — LISINOPRIL 20 MG PO TABS: 20.0000 mg | ORAL_TABLET | Freq: Every day | ORAL | 0 refills | Status: DC

## 2024-02-15 ENCOUNTER — Other Ambulatory Visit: Payer: Self-pay | Admitting: Family

## 2024-02-15 DIAGNOSIS — I10 Essential (primary) hypertension: Secondary | ICD-10-CM

## 2024-02-17 ENCOUNTER — Encounter: Payer: Self-pay | Admitting: Family

## 2024-02-22 ENCOUNTER — Ambulatory Visit: Admitting: Physical Therapy

## 2024-02-29 ENCOUNTER — Encounter

## 2024-03-07 ENCOUNTER — Encounter

## 2024-03-14 ENCOUNTER — Encounter

## 2024-03-21 ENCOUNTER — Encounter: Admitting: Physical Therapy

## 2024-04-03 ENCOUNTER — Other Ambulatory Visit: Payer: Self-pay | Admitting: Family

## 2024-04-03 DIAGNOSIS — I10 Essential (primary) hypertension: Secondary | ICD-10-CM

## 2024-04-16 ENCOUNTER — Other Ambulatory Visit: Payer: Self-pay | Admitting: Family

## 2024-07-17 ENCOUNTER — Other Ambulatory Visit: Payer: Self-pay | Admitting: Family

## 2024-12-19 ENCOUNTER — Encounter: Admitting: Family
